# Patient Record
Sex: Female | Born: 1980 | Race: White | Hispanic: No | Marital: Married | Smoking: Never smoker
Health system: Southern US, Community
[De-identification: ages and names within clinical notes are randomized; demographics above are authoritative.]

## PROBLEM LIST (undated history)

## (undated) DIAGNOSIS — IMO0002 Reserved for concepts with insufficient information to code with codable children: Secondary | ICD-10-CM

## (undated) DIAGNOSIS — J342 Deviated nasal septum: Secondary | ICD-10-CM

## (undated) DIAGNOSIS — Z789 Other specified health status: Secondary | ICD-10-CM

## (undated) DIAGNOSIS — R87619 Unspecified abnormal cytological findings in specimens from cervix uteri: Secondary | ICD-10-CM

## (undated) HISTORY — DX: Unspecified abnormal cytological findings in specimens from cervix uteri: R87.619

## (undated) HISTORY — PX: DILATION AND CURETTAGE, DIAGNOSTIC / THERAPEUTIC: SUR384

## (undated) HISTORY — DX: Deviated nasal septum: J34.2

## (undated) HISTORY — DX: Reserved for concepts with insufficient information to code with codable children: IMO0002

## (undated) HISTORY — PX: DILATION AND CURETTAGE OF UTERUS: SHX78

## (undated) HISTORY — PX: WISDOM TOOTH EXTRACTION: SHX21

---

## 2002-01-20 ENCOUNTER — Encounter: Payer: Self-pay | Admitting: Emergency Medicine

## 2002-01-20 ENCOUNTER — Emergency Department (HOSPITAL_COMMUNITY): Admission: EM | Admit: 2002-01-20 | Discharge: 2002-01-20 | Payer: Self-pay | Admitting: Emergency Medicine

## 2004-02-14 ENCOUNTER — Other Ambulatory Visit: Admission: RE | Admit: 2004-02-14 | Discharge: 2004-02-14 | Payer: Self-pay | Admitting: Obstetrics and Gynecology

## 2005-06-20 ENCOUNTER — Other Ambulatory Visit: Admission: RE | Admit: 2005-06-20 | Discharge: 2005-06-20 | Payer: Self-pay | Admitting: Obstetrics and Gynecology

## 2006-07-06 ENCOUNTER — Inpatient Hospital Stay (HOSPITAL_COMMUNITY): Admission: AD | Admit: 2006-07-06 | Discharge: 2006-07-10 | Payer: Self-pay | Admitting: Obstetrics and Gynecology

## 2006-07-07 ENCOUNTER — Encounter (INDEPENDENT_AMBULATORY_CARE_PROVIDER_SITE_OTHER): Payer: Self-pay | Admitting: *Deleted

## 2008-10-26 ENCOUNTER — Encounter: Admission: RE | Admit: 2008-10-26 | Discharge: 2008-10-26 | Payer: Self-pay | Admitting: Obstetrics and Gynecology

## 2010-09-19 ENCOUNTER — Observation Stay (HOSPITAL_COMMUNITY)
Admission: EM | Admit: 2010-09-19 | Discharge: 2010-09-20 | Disposition: A | Discharge status: Home / Self Care | Payer: Managed Care, Other (non HMO) | Attending: Emergency Medicine | Admitting: Emergency Medicine

## 2010-09-19 ENCOUNTER — Emergency Department (HOSPITAL_COMMUNITY): Payer: Managed Care, Other (non HMO)

## 2010-09-19 ENCOUNTER — Inpatient Hospital Stay (HOSPITAL_COMMUNITY)
Admission: AD | Admit: 2010-09-19 | Discharge: 2010-09-19 | Disposition: A | Discharge status: Other Acute Inpatient Hospital | Payer: Managed Care, Other (non HMO) | Source: Ambulatory Visit | Attending: Obstetrics and Gynecology | Admitting: Obstetrics and Gynecology

## 2010-09-19 ENCOUNTER — Observation Stay (HOSPITAL_COMMUNITY): Payer: Managed Care, Other (non HMO)

## 2010-09-19 DIAGNOSIS — O99891 Other specified diseases and conditions complicating pregnancy: Secondary | ICD-10-CM | POA: Insufficient documentation

## 2010-09-19 DIAGNOSIS — H538 Other visual disturbances: Principal | ICD-10-CM | POA: Insufficient documentation

## 2010-09-19 DIAGNOSIS — O9989 Other specified diseases and conditions complicating pregnancy, childbirth and the puerperium: Secondary | ICD-10-CM | POA: Insufficient documentation

## 2010-09-19 LAB — URINALYSIS, ROUTINE W REFLEX MICROSCOPIC
Bilirubin Urine: NEGATIVE
Hgb urine dipstick: NEGATIVE
Ketones, ur: NEGATIVE mg/dL
Nitrite: NEGATIVE
Protein, ur: NEGATIVE mg/dL
Specific Gravity, Urine: 1.01 (ref 1.005–1.030)
Urine Glucose, Fasting: NEGATIVE mg/dL
Urobilinogen, UA: 0.2 mg/dL (ref 0.0–1.0)
pH: 7.5 (ref 5.0–8.0)

## 2010-09-19 LAB — POCT I-STAT, CHEM 8
BUN: 5 mg/dL — ABNORMAL LOW (ref 6–23)
Calcium, Ion: 1.19 mmol/L (ref 1.12–1.32)
Chloride: 104 mEq/L (ref 96–112)
Creatinine, Ser: 0.7 mg/dL (ref 0.4–1.2)
Glucose, Bld: 106 mg/dL — ABNORMAL HIGH (ref 70–99)
HCT: 40 % (ref 36.0–46.0)
Hemoglobin: 13.6 g/dL (ref 12.0–15.0)
Potassium: 3.6 mEq/L (ref 3.5–5.1)
Sodium: 139 mEq/L (ref 135–145)
TCO2: 21 mmol/L (ref 0–100)

## 2010-09-19 LAB — CBC
HCT: 37.9 % (ref 36.0–46.0)
Hemoglobin: 13.3 g/dL (ref 12.0–15.0)
MCH: 29.8 pg (ref 26.0–34.0)
MCHC: 35.1 g/dL (ref 30.0–36.0)
MCV: 85 fL (ref 78.0–100.0)
Platelets: 235 10*3/uL (ref 150–400)
RBC: 4.46 MIL/uL (ref 3.87–5.11)
RDW: 14 % (ref 11.5–15.5)
WBC: 10.4 10*3/uL (ref 4.0–10.5)

## 2010-09-19 LAB — COMPREHENSIVE METABOLIC PANEL
ALT: 13 U/L (ref 0–35)
AST: 15 U/L (ref 0–37)
Albumin: 3.9 g/dL (ref 3.5–5.2)
Alkaline Phosphatase: 35 U/L — ABNORMAL LOW (ref 39–117)
BUN: 6 mg/dL (ref 6–23)
CO2: 23 meq/L (ref 19–32)
Calcium: 9.1 mg/dL (ref 8.4–10.5)
Chloride: 107 mEq/L (ref 96–112)
Creatinine, Ser: 0.69 mg/dL (ref 0.4–1.2)
GFR calc Af Amer: >60 mL/min (ref >60)
GFR calc non Af Amer: >60 mL/min (ref >60)
Glucose, Bld: 112 mg/dL — ABNORMAL HIGH (ref 70–99)
Potassium: 3.7 mEq/L (ref 3.5–5.1)
Sodium: 138 mEq/L (ref 135–145)
Total Bilirubin: 0.5 mg/dL (ref 0.3–1.2)
Total Protein: 6.6 g/dL (ref 6.0–8.3)

## 2010-09-19 LAB — DIFFERENTIAL
Basophils Absolute: 0 10*3/uL (ref 0.0–0.1)
Basophils Relative: 0 % (ref 0–1)
Eosinophils Absolute: 0.1 10*3/uL (ref 0.0–0.7)
Eosinophils Relative: 1 % (ref 0–5)
Lymphocytes Relative: 14 % (ref 12–46)
Lymphs Abs: 1.5 10*3/uL (ref 0.7–4.0)
Monocytes Absolute: 0.6 10*3/uL (ref 0.1–1.0)
Monocytes Relative: 6 % (ref 3–12)
Neutro Abs: 8.3 10*3/uL — ABNORMAL HIGH (ref 1.7–7.7)
Neutrophils Relative %: 80 % — ABNORMAL HIGH (ref 43–77)

## 2010-09-19 LAB — APTT: aPTT: 28 seconds (ref 24–37)

## 2010-09-19 LAB — POCT CARDIAC MARKERS
CKMB, poc: <1 ng/mL — ABNORMAL LOW (ref 1.0–8.0)
Myoglobin, poc: 32.1 ng/mL (ref 12–200)
Troponin i, poc: <0.05 ng/mL (ref 0.00–0.09)

## 2010-09-19 LAB — CK TOTAL AND CKMB (NOT AT ARMC)
CK, MB: 0.5 ng/mL (ref 0.3–4.0)
Relative Index: INVALID (ref 0.0–2.5)
Total CK: 56 U/L (ref 7–177)

## 2010-09-19 LAB — PROTIME-INR
INR: 1.13 (ref 0.00–1.49)
Prothrombin Time: 14.7 s (ref 11.6–15.2)

## 2010-09-19 LAB — GLUCOSE, CAPILLARY: Glucose-Capillary: 95 mg/dL (ref 70–99)

## 2010-09-19 LAB — TROPONIN I: Troponin I: <0.01 ng/mL (ref 0.00–0.06)

## 2010-09-20 ENCOUNTER — Other Ambulatory Visit: Payer: Self-pay | Admitting: Obstetrics and Gynecology

## 2010-09-20 ENCOUNTER — Ambulatory Visit (HOSPITAL_COMMUNITY)
Admission: RE | Admit: 2010-09-20 | Discharge: 2010-09-20 | Disposition: A | Discharge status: Home / Self Care | Payer: Managed Care, Other (non HMO) | Source: Ambulatory Visit | Attending: Obstetrics and Gynecology | Admitting: Obstetrics and Gynecology

## 2010-09-20 DIAGNOSIS — G459 Transient cerebral ischemic attack, unspecified: Secondary | ICD-10-CM

## 2010-09-20 DIAGNOSIS — O021 Missed abortion: Secondary | ICD-10-CM | POA: Insufficient documentation

## 2010-09-20 LAB — LIPID PANEL
Cholesterol: 119 mg/dL (ref 0–200)
HDL: 59 mg/dL (ref >39)
LDL Cholesterol: 55 mg/dL (ref 0–99)
Total CHOL/HDL Ratio: 2 {ratio}
Triglycerides: 26 mg/dL (ref <150)
VLDL: 5 mg/dL (ref 0–40)

## 2010-09-20 LAB — HEMOGLOBIN A1C
Hgb A1c MFr Bld: 5.2 % (ref <5.7)
Mean Plasma Glucose: 103 mg/dL (ref <117)

## 2010-09-20 LAB — HCG, QUANTITATIVE, PREGNANCY: hCG, Beta Chain, Quant, S: 47378 m[IU]/mL — ABNORMAL HIGH (ref <5)

## 2010-10-03 NOTE — Op Note (Signed)
  [name], [name] NO.:  [id number]  MEDICAL RECORD NO.:  [id number]           PATIENT TYPE:  O  LOCATION:  [facility]                          [facility]  PHYSICIAN:  Janine Limbo, M.D.DATE OF BIRTH:  [birth year]  DATE OF PROCEDURE:  09/20/2010 DATE OF DISCHARGE:                              OPERATIVE REPORT   PREOPERATIVE DIAGNOSIS:  First trimester missed abortion.  POSTOPERATIVE DIAGNOSIS:  First trimester missed abortion.  PROCEDURE:  Suction dilatation and evacuation.  SURGEON:  Janine Limbo, MD  FIRST ASSISTANT:  None.  ANESTHETIC:  Monitored anesthetic control and paracervical block using 0.5% Marcaine with epinephrine.  DISPOSITION:  [name] is a 30 year old female, gravida 3, para 1-0-1- 1, who presents at 8-1/2 weeks' gestation with decreasing quantitative HCG values and a nonviable gestation on ultrasound.  The patient understands the indications for her surgical procedure and she accepts the risks of, but not limited to, anesthetic complications, bleeding, infection, and possible damage to the surrounding organs.  FINDINGS:  The patient's blood type is B+.  A moderate amount of products of conception were removed from within the uterine cavity.  No adnexal masses were appreciated.  PROCEDURE:  The patient was taken to the operating room where she was given medication through her IV line.  The patient's perineum and vagina were prepped with multiple layers of Betadine.  The bladder was drained of urine.  The patient was sterilely draped.  Examination under anesthesia was performed.  A paracervical block was placed using 10 mL of 0.5% Marcaine with epinephrine.  The uterus sounded to 9 cm.  The cervix was gently dilated.  The uterine cavity was then evacuated using a size 8 suction curette followed by medium sharp curette.  The cavity was felt to be clean at the end of our procedure.  Hemostasis was adequate.  All  instruments were removed.  The patient's exam was repeated.  Sponge and needle counts were correct.  The patient was awakened from her anesthetic without difficulty and then transported to the recovery room in stable condition.  She was given 30 mg of Toradol intramuscularly and 30 mg of Toradol intravenously.  The products of conception were sent to Pathology.  FOLLOWUP INSTRUCTIONS:  The patient will return to see Dr. Stefano Gaul in 2 weeks.  She was given a copy of the postoperative instruction sheet as prepared by the [facility] for patients who have undergone dilatation and evacuation.  She was given a prescription for: 1. Motrin 800 mg every 8 hours as needed for mild to moderate pain. 2. Vicodin 1-2 tablets every 4 hours as needed for severe pain. 3. Phenergan 25 mg every 6 hours as needed for nausea. 4. Methergine 0.2 mg 1 tablet every 8 hours for 2 days.     Janine Limbo, M.D.     AVS/MEDQ  D:  09/20/2010  T:  09/21/2010  Job:  [id number]  Electronically Signed by Kirkland Hun M.D. on 10/03/2010 11:16:05 AM

## 2010-10-03 NOTE — H&P (Signed)
  NAMELAVON, HORN NO.:  [id number]  MEDICAL RECORD NO.:  [id number]           PATIENT TYPE:  O  LOCATION:  [facility]                          [facility]  PHYSICIAN:  Janine Limbo, M.D.DATE OF BIRTH:  06/21/1981  DATE OF ADMISSION:  09/20/2010 DATE OF DISCHARGE:                             HISTORY & PHYSICAL   HISTORY OF PRESENT ILLNESS:  Ms. Schremp is a 30 year old female, gravida 3, para 1-0-1-1, who presents at 8-1/2 weeks' gestation with a missed abortion.  The patient has been followed at the [facility] of [facility]. The patient had a quantitative HCG values of 65,000 that was repeated and her HCG value was then 47,000.  Several ultrasounds have shown a nonviable gestation.  She presents for a dilatation and evacuation.  OBSTETRICAL HISTORY:  The patient has had one term cesarean section and one first trimester miscarriage.  DRUG ALLERGIES:  No known drug allergies.  PAST MEDICAL HISTORY:  The patient denies hypertension and diabetes.  REVIEW OF SYSTEMS:  The patient denies cigarette use, alcohol use, and recreational drug use.  REVIEW OF SYSTEMS:  The patient has had some decreased vision and has been evaluated by the emergency department at the [facility].  FAMILY HISTORY:  Noncontributory.  PHYSICAL EXAM:  VITAL SIGNS:  The patient's weight is approximately 160 pounds, her height is 5 feet 90 inches. HEENT:  Within normal limits. CHEST:  Clear. HEART:  Regular rate and rhythm. BREASTS:  Without masses. ABDOMEN:  Nontender. EXTREMITIES:  Grossly normal. NEUROLOGIC:  Grossly normal. PELVIC:  Cervix is closed and long.  LABORATORY VALUES:  Blood type is B+, antibody screen negative, VDRL nonreactive, rubella immune.  HBsAG negative, HIV nonreactive, gonorrhea negative, chlamydia negative.  ASSESSMENT: 1. An 8-1/2 weeks' gestation. 2. Missed abortion.  PLAN:   The patient will undergo a dilatation and evacuation.  The patient understands the indications for her procedure and she accepts the risks of, but not limited to, anesthetic complications, bleeding, infection, and possible damage to the surrounding organs.     Janine Limbo, M.D.     AVS/MEDQ  D:  09/20/2010  T:  09/21/2010  Job:  [id number]  Electronically Signed by Kirkland Hun M.D. on 10/03/2010 11:15:54 AM

## 2010-12-08 NOTE — Op Note (Signed)
[name], [name]           ACCOUNT NO.:  [id number]   MEDICAL RECORD NO.:  [id number]          PATIENT TYPE:  INP   LOCATION:  9163                          [facility]  [facility]   PHYSICIAN:  Guy Sandifer. Henderson Cloud, M.D. DATE OF BIRTH:  1980-09-24   DATE OF PROCEDURE:  07/07/2006  DATE OF DISCHARGE:                               OPERATIVE REPORT   PREOPERATIVE DIAGNOSIS:  1. Nonreassuring fetal heart tracing.  2. Arrest of dilation.   POSTOPERATIVE DIAGNOSIS:  1. Nonreassuring fetal heart tracing.  2. Arrest of dilation.   PROCEDURE:  Low transverse cesarean section.   SURGEON:  Guy Sandifer. Henderson Cloud, M.D.   ANESTHESIA:  Epidural Burnett Corrente, M.D.   SPECIMENS:  Placenta to pathology.   ESTIMATED BLOOD LOSS:  800 mL.   FINDINGS:  Viable female infant, Apgars of 9and 9 at 1 and 5 minutes  respectively.  Birth weight 8 pounds 5 ounces.  Arterial cord pH 7.29.   INDICATIONS FOR PROCEDURE:  The patient is a 30 year old single white  female G1, P0, EDC of July 14, 2006 with a negative strep culture.  The patient complained of spontaneous rupture of membranes for clear  fluid at approximately 10:30 p.m. on July 06, 2006.  She was  observed through the night and had relatively few uterine contractions.  Fetal heart tones were reactive.  Cervix is a 1, thick, and posterior  per the nurse's checked.  Ultrasound confirms vertex presentation.  Pitocin augmentation was started out.  At approximately 2:00 p.m. cervix  3 cm dilated, 80 percent effaced, -1 station.  Internal uterine pressure  catheter was placed.  As of 4:30 p.m. cervix is essentially the same per  the nurse's checked.  There is some variable decelerations.  Temperature  is 100.1.  The patient's given Tylenol and Unasyn is started  intravenously.  Epidural is ordered p.r.n.  As of approximately 6:45  p.m. the patient was noted to have some deeper decelerations to the 60s.  Pitocin was discontinued and oxygen was  administered.  The patient has  already received Effedra and intravenous fluids.  Cervix was 4 cm  dilated, complete, -2 station.  Observation over additional 15-20  minutes revealed a fetal heart tracing of 160s with some subtle  decelerations and no accelerations.  Diagnosis of arrest of dilation as  well as nonreassuring fetal heart tracing was made.  Recommendation for  cesarean section is made.  All questions were answered.  The patient  agrees.  Potential risks and complications discussed preoperatively  including but not limited to infection, bowel, bladder, ureteral damage,  bleeding requiring transfusion of blood products with possible  transfusion reaction, HIV and hepatitis acquisition, DVT, PE, pneumonia.   PROCEDURE:  The patient is taken to operating room where she is  identified.  Epidural was augmented to surgical level.  She is placed in  dorsosupine position with 15 degrees left lateral wedge.  Foley catheter  was already in place.  She is prepped and draped in sterile fashion.  After testing for adequate epidural anesthesia skin is entered through  Pfannenstiel incision and dissection is carried out in  layers to  peritoneum.  Peritoneum was incised and extended superiorly and  inferiorly.  Vesicouterine peritoneum was taken down cephalolaterally.  The bladder flap was developed.  The bladder blade is placed.  Uterus  was incised in a low transverse manner.  The uterine cavity is entered  bluntly with a hemostat.  The uterine incision is extended  cephalolaterally with fingers.  Baby is noted to be left occiput  transverse.  Vertex is easily elevated and delivered.  Oral and  nasopharynx were suctioned.  Remainder of the baby is delivered and good  cry and tone is noted.  Cord is clamped and cut and the baby is handed  to awaiting pediatrics team.  Placenta was manually delivered and sent  to pathology.  Uterus is clean.  Uterus is closed in two running locking   imbricating layers of 0 Monocryl suture.  A single figure-of-eight is  placed in the middle of the incision to obtain complete hemostasis.  Tubes and ovaries are normal bilaterally.  Anterior peritoneum was  closed in running fashion with 0 Monocryl suture which was also used to  reapproximate the pyramidalis muscle in the midline.  Anterior rectus  fascia is closed running fashion with 0 PDS suture and the skin is  closed with clips.  All sponge, instrument and needle counts correct and  the patient is transferred to recovery room in stable condition.      Guy Sandifer Henderson Cloud, M.D.  Electronically Signed     JET/MEDQ  D:  07/07/2006  T:  07/07/2006  Job:  [id number]

## 2010-12-08 NOTE — Discharge Summary (Signed)
NAMELAINEE, LEHRMAN           ACCOUNT NO.:  [id number]   MEDICAL RECORD NO.:  [id number]          PATIENT TYPE:  INP   LOCATION:  9102                          [facility]   PHYSICIAN:  Michelle L. Grewal, M.D.DATE OF BIRTH:  06-20-1981   DATE OF ADMISSION:  07/06/2006  DATE OF DISCHARGE:  07/10/2006                               DISCHARGE SUMMARY   ADMITTING DIAGNOSES:  1. Intrauterine pregnancy at 55 weeks estimated gestational age.  2. Spontaneous rupture of membranes.   DISCHARGE DIAGNOSES:  1. Status post low transverse cesarean section secondary to arrest of      dilatation and nonreassuring fetal heart tones.  2. Viable female infant.   PROCEDURE:  Primary low transverse cesarean section.   REASON FOR ADMISSION:  Please see written H&P.   HOSPITAL COURSE:  The patient is 30 year old white single female  primigravida that presented to [facility] with  spontaneous rupture of membranes.  Fluid was noted to be clear.  The  patient had known negative beta strep culture.  The patient was observed  through the night and had relatively few contractions.  Fetal heart  tones were reactive.  Cervix was dilated to 1 cm, thick, and posterior.  Ultrasound was performed which revealed and confirmed vertex  presentation.  Pitocin augmentation was started and intrauterine  pressure catheter was placed for observation of adequate labor.  On the  following afternoon, cervix was essentially unchanged and there were  noted some variable decelerations.  The patient did develop a fever of  100.1.  She was given Tylenol and Unasyn was started intravenously.  The  patient was administered epidural for her comfort.  Later the patient  was noted to have some further fetal deceleration into the 60s and  Pitocin was discontinued and oxygen administration was given.  The  patient had already received ephedra and IV fluids.  Cervix was now 4 cm  dilated, completely effaced,  with vertex at a -2 station.  Observation  over an additional 15-20 minutes revealed some fetal heart tone tracings  of 160s with some subtle decelerations and no acceleration.  Decision  was made to proceed with a primary low transverse cesarean section.  The  patient was then transferred to the operating room where epidural was  dosed to an adequate surgical level.  A low transverse incision was made  with delivery of a viable female infant weighing 8 pounds 5 ounces with  Apgars of 9 at 1 minute, 9 at 5 minutes.  Arterial cord ph was 7.29.  The patient tolerated the procedure well and was taken to the recovery  room in stable condition.  The following morning, the patient was  without complaint.  Vital signs were stable.  She was afebrile.  Temp  current was 98.4, temp max of 100.5 at 9:30 the previous evening.  Abdomen soft with good return of bowel function.  Fundus was firm and  nontender.  Abdominal dressing was noted to be clean, dry and intact.  Laboratory findings revealed hemoglobin of 10.1, platelet count of  174,000, WBC count of 22.2. Intravenous antibiotics were continued for  a  total 24 hours.  On the following morning, the patient was without  complaint.  Vital signs were stable.  She was now afebrile.  Abdomen  soft.  Fundus firm and nontender.  Abdominal dressing had been removed  revealing an incision that was clean, dry and intact. Intravenous  antibiotics were discontinued.  The patient was tolerating a regular  diet without complaints of nausea or vomiting. On postoperative day #3,  the patient was still without complaint.  Vital signs were stable.  Abdomen soft.  Fundus firm and nontender.  Incision was clean, dry and  intact. Staples were removed and patient was later discharged home.   CONDITION ON DISCHARGE:  Stable.   DIET:  Regular, as tolerated.   ACTIVITY:  No heavy lifting, no driving times 2 weeks, no vaginal entry.   FOLLOWUP:  The patient to follow  up in the office in 1 week for an  incision check.  She is to call for temperature greater than 100  degrees, persistent nausea, vomiting, heavy vaginal bleeding, and/or  redness or drainage from the incisional site.   DISCHARGE MEDICATIONS:  Tylox #30 one p.o. every 4-6 hours p.r.n.,  Motrin every 6 hours p.r.n., prenatal vitamins one p.o. daily, Colace 1  p.o. daily p.r.n.      Julio Sicks, N.P.      Stann Mainland. Vincente Poli, M.D.  Electronically Signed    CC/MEDQ  D:  07/26/2006  T:  07/26/2006  Job:  [id number]

## 2011-01-11 LAB — HIV ANTIBODY (ROUTINE TESTING W REFLEX): HIV: NONREACTIVE

## 2011-01-11 LAB — ABO/RH: RH Type: POSITIVE

## 2011-01-11 LAB — HEPATITIS B SURFACE ANTIGEN: Hepatitis B Surface Ag: NEGATIVE

## 2011-01-11 LAB — GC/CHLAMYDIA PROBE AMP, GENITAL
Chlamydia: NEGATIVE
Gonorrhea: NEGATIVE

## 2011-01-11 LAB — ANTIBODY SCREEN: Antibody Screen: NEGATIVE

## 2011-01-11 LAB — RPR: RPR: NONREACTIVE

## 2011-01-11 LAB — RUBELLA ANTIBODY, IGM: Rubella: IMMUNE

## 2011-07-24 NOTE — L&D Delivery Note (Signed)
Delivery Note  At 0945 pt was having stronger urge to push and was complete and got back in tub and pushed well w ctx on hands and knees for SVD.  At 10:30 AM a viable female was delivered via VBAC, Spontaneous (Presentation: Right Occiput Anterior).  APGAR: 9, 9; weight .  9#13oz Placenta status: Intact, Manual removal.  Cord: 3 vessels with the following complications: None.  Cord evulsed, manual placenta removal  Anesthesia: None  Episiotomy: none Lacerations: 1st degree and R and L labial Suture Repair: 3.0 vicryl rapide 4-0 monocryl Est. Blood Loss (mL): 350  Mom to postpartum.  Baby to nursery-stable. Skin-skin Mom plans to BF Dr Estanislado Pandy notified   Malissa Hippo 08/27/2011, 11:44 AM

## 2011-07-26 LAB — STREP B DNA PROBE: GBS: POSITIVE

## 2011-08-26 ENCOUNTER — Inpatient Hospital Stay (HOSPITAL_COMMUNITY)
Admission: AD | Admit: 2011-08-26 | Discharge: 2011-08-29 | DRG: 372 | Disposition: A | Discharge status: Home / Self Care | Payer: Federal, State, Local not specified - PPO | Source: Ambulatory Visit | Attending: Obstetrics and Gynecology | Admitting: Obstetrics and Gynecology

## 2011-08-26 ENCOUNTER — Encounter (HOSPITAL_COMMUNITY): Payer: Self-pay | Admitting: *Deleted

## 2011-08-26 DIAGNOSIS — Z22330 Carrier of Group B streptococcus: Secondary | ICD-10-CM

## 2011-08-26 DIAGNOSIS — O99892 Other specified diseases and conditions complicating childbirth: Secondary | ICD-10-CM | POA: Diagnosis present

## 2011-08-26 DIAGNOSIS — O34219 Maternal care for unspecified type scar from previous cesarean delivery: Principal | ICD-10-CM | POA: Diagnosis present

## 2011-08-26 LAB — CBC
HCT: 35 % — ABNORMAL LOW (ref 36.0–46.0)
Hemoglobin: 11.5 g/dL — ABNORMAL LOW (ref 12.0–15.0)
MCH: 25.3 pg — ABNORMAL LOW (ref 26.0–34.0)
MCHC: 32.9 g/dL (ref 30.0–36.0)
MCV: 77.1 fL — ABNORMAL LOW (ref 78.0–100.0)
Platelets: 188 10*3/uL (ref 150–400)
RBC: 4.54 MIL/uL (ref 3.87–5.11)
RDW: 15.2 % (ref 11.5–15.5)
WBC: 13.7 10*3/uL — ABNORMAL HIGH (ref 4.0–10.5)

## 2011-08-26 MED ORDER — SODIUM CHLORIDE 0.9 % IJ SOLN: 3.0000 mL | INTRAMUSCULAR | Status: DC | PRN

## 2011-08-26 MED ORDER — OXYTOCIN 20 UNITS IN LACTATED RINGERS INFUSION - SIMPLE
125.0000 mL/h | Freq: Once | INTRAVENOUS | Status: AC
  Administered 2011-08-27: 125 mL/h via INTRAVENOUS

## 2011-08-26 MED ORDER — LACTATED RINGERS IV SOLN: INTRAVENOUS | Status: DC

## 2011-08-26 MED ORDER — OXYTOCIN BOLUS FROM INFUSION
500.0000 mL | Freq: Once | INTRAVENOUS | Status: DC
  Filled 2011-08-26: qty 500
  Filled 2011-08-26: qty 1000

## 2011-08-26 MED ORDER — IBUPROFEN 600 MG PO TABS
600.0000 mg | ORAL_TABLET | Freq: Four times a day (QID) | ORAL | Status: DC | PRN
  Administered 2011-08-27: 600 mg via ORAL
  Filled 2011-08-26: qty 1

## 2011-08-26 MED ORDER — PROMETHAZINE HCL 25 MG/ML IJ SOLN: 12.5000 mg | INTRAMUSCULAR | Status: DC | PRN

## 2011-08-26 MED ORDER — CITRIC ACID-SODIUM CITRATE 334-500 MG/5ML PO SOLN: 30.0000 mL | ORAL | Status: DC | PRN

## 2011-08-26 MED ORDER — ACETAMINOPHEN 325 MG PO TABS: 650.0000 mg | ORAL_TABLET | ORAL | Status: DC | PRN

## 2011-08-26 MED ORDER — LIDOCAINE HCL (PF) 1 % IJ SOLN
30.0000 mL | INTRAMUSCULAR | Status: DC | PRN
  Administered 2011-08-27: 30 mL via SUBCUTANEOUS
  Filled 2011-08-26: qty 30

## 2011-08-26 MED ORDER — ONDANSETRON HCL 4 MG/2ML IJ SOLN: 4.0000 mg | Freq: Four times a day (QID) | INTRAMUSCULAR | Status: DC | PRN

## 2011-08-26 MED ORDER — PENICILLIN G POTASSIUM 5000000 UNITS IJ SOLR
5.0000 10*6.[IU] | Freq: Once | INTRAVENOUS | Status: AC
  Administered 2011-08-26: 5 10*6.[IU] via INTRAVENOUS
  Filled 2011-08-26: qty 5

## 2011-08-26 MED ORDER — LACTATED RINGERS IV SOLN: 500.0000 mL | INTRAVENOUS | Status: DC | PRN

## 2011-08-26 MED ORDER — PENICILLIN G POTASSIUM 5000000 UNITS IJ SOLR
2.5000 10*6.[IU] | INTRAVENOUS | Status: DC
  Administered 2011-08-27 (×2): 2.5 10*6.[IU] via INTRAVENOUS
  Filled 2011-08-26 (×5): qty 2.5

## 2011-08-26 MED ORDER — SODIUM CHLORIDE 0.9 % IV SOLN: 250.0000 mL | INTRAVENOUS | Status: DC | PRN

## 2011-08-26 MED ORDER — OXYCODONE-ACETAMINOPHEN 5-325 MG PO TABS: 1.0000 | ORAL_TABLET | ORAL | Status: DC | PRN

## 2011-08-26 MED ORDER — FLEET ENEMA 7-19 GM/118ML RE ENEM: 1.0000 | ENEMA | RECTAL | Status: DC | PRN

## 2011-08-26 NOTE — Progress Notes (Signed)
Pt states she is in labor and her water broke at 1630

## 2011-08-26 NOTE — Progress Notes (Signed)
Pt states her water broke @ 1630 clear fluid.  Also c/o ctx q 5-6 min apart

## 2011-08-26 NOTE — ED Provider Notes (Signed)
[name] is a 31 y.o. female presenting for evaluation secondary to SROM at 4:30p.  History OB History    Grav Para Term Preterm Abortions TAB SAB Ect Mult Living   4 1 1  2  2   1      Past Medical History  Diagnosis Date  . No pertinent past medical history    Past Surgical History  Procedure Date  . Cesarean section   . Wisdom tooth extraction    Family History: family history is not on file. Social History:  reports that she has never smoked. She does not have any smokeless tobacco history on file. She reports that she does not drink alcohol or use illicit drugs.  ROS Noncontributory.  Denies any other symptoms.  Dilation: 3 Effacement (%): 100 Station: -1 Exam by:: M. Chiquita Loth CNM Blood pressure 121/81, pulse 88, temperature 98 F (36.7 C), resp. rate 20, height 5\' 9"  (1.753 m), weight 88.905 kg (196 lb), SpO2 99.00%.   Exam Physical Exam  Lungs CTA bil CV RRR Abd gravid, NT Ext no calf tenderness  FHT 130s - 140s, + small accels, no decels, + variability Toco about q 5 min but not picking up well  Prenatal labs: ABO, Rh:  B+ Antibody:  neg Rubella:  Immune RPR:   NR HBsAg:   neg HIV:   NR GBS:   positive Normal anatomy scan Declined Genetic Testing  Spec Exam pooling (per Lavera Guise)  Assessment/Plan: P1 @ 40 2/7 wks with SROM in early labor desiring a VBAC via waterbirth.  Patient understands the risks/ benefits/alternatives of a VBAC and would like to proceed.  Fetal status is overall reassuring.  Pt says she signed consent for VBAC in office.PCN for GBS+.  Purcell Nails 08/26/2011, 9:43 PM

## 2011-08-26 NOTE — H&P (Signed)
[name] is a 31 y.o. female presenting for evaluation secondary to SROM at 4:30p. No VB, + ctxs, +FM.  History  OB History    Grav  Para  Term  Preterm  Abortions  TAB  SAB  Ect  Mult  Living    4  1  1   2   2    1       Past Medical History   Diagnosis  Date   .  No pertinent past medical history     Past Surgical History   Procedure  Date   .  Cesarean section    .  Wisdom tooth extraction     Family History: family history is not on file.  Social History: reports that she has never smoked. She does not have any smokeless tobacco history on file. She reports that she does not drink alcohol or use illicit drugs.   ROS  Noncontributory. Denies any other symptoms.   Dilation: 3  Effacement (%): 100  Station: -1  Exam by:: M. Chiquita Loth CNM  Blood pressure 121/81, pulse 88, temperature 98 F (36.7 C), resp. rate 20, height 5\' 9"  (1.753 m), weight 88.905 kg (196 lb), SpO2 99.00%.  Exam  Physical Exam  Lungs CTA bil  CV RRR  Abd gravid, NT  Ext no calf tenderness  FHT 130s - 140s, + small accels, no decels, + variability  Toco about q 5 min but not picking up well   Prenatal labs:  ABO, Rh: B+  Antibody: neg  Rubella: Immune  RPR: NR  HBsAg: neg  HIV: NR  GBS: positive  Normal anatomy scan  Declined Genetic Testing  Spec Exam pooling (per Lavera Guise)   Assessment/Plan:  P1 @ 40 2/7 wks with SROM in early labor desiring a VBAC via waterbirth. Patient understands the risks/ benefits/alternatives of a VBAC and would like to proceed. Fetal status is overall reassuring. Pt says she signed consent for VBAC in office.PCN for GBS+.  Purcell Nails  08/26/2011, 9:43 PM

## 2011-08-27 ENCOUNTER — Encounter (HOSPITAL_COMMUNITY): Payer: Self-pay | Admitting: *Deleted

## 2011-08-27 LAB — RPR: RPR Ser Ql: NONREACTIVE

## 2011-08-27 MED ORDER — SODIUM CHLORIDE 0.9 % IJ SOLN: 3.0000 mL | Freq: Two times a day (BID) | INTRAMUSCULAR | Status: DC

## 2011-08-27 MED ORDER — SENNOSIDES-DOCUSATE SODIUM 8.6-50 MG PO TABS
2.0000 | ORAL_TABLET | Freq: Every day | ORAL | Status: DC
  Administered 2011-08-27 – 2011-08-28 (×2): 2 via ORAL

## 2011-08-27 MED ORDER — BUTORPHANOL TARTRATE 2 MG/ML IJ SOLN: 2.0000 mg | INTRAMUSCULAR | Status: DC | PRN

## 2011-08-27 MED ORDER — OXYTOCIN BOLUS FROM INFUSION: 500.0000 mL | Freq: Once | INTRAVENOUS | Status: DC

## 2011-08-27 MED ORDER — ONDANSETRON HCL 4 MG/2ML IJ SOLN: 4.0000 mg | Freq: Four times a day (QID) | INTRAMUSCULAR | Status: DC | PRN

## 2011-08-27 MED ORDER — ONDANSETRON HCL 4 MG PO TABS: 4.0000 mg | ORAL_TABLET | ORAL | Status: DC | PRN

## 2011-08-27 MED ORDER — ZOLPIDEM TARTRATE 5 MG PO TABS: 5.0000 mg | ORAL_TABLET | Freq: Every evening | ORAL | Status: DC | PRN

## 2011-08-27 MED ORDER — LACTATED RINGERS IV SOLN: INTRAVENOUS | Status: DC

## 2011-08-27 MED ORDER — FLEET ENEMA 7-19 GM/118ML RE ENEM: 1.0000 | ENEMA | RECTAL | Status: DC | PRN

## 2011-08-27 MED ORDER — WITCH HAZEL-GLYCERIN EX PADS: 1.0000 "application " | MEDICATED_PAD | CUTANEOUS | Status: DC | PRN

## 2011-08-27 MED ORDER — LIDOCAINE HCL (PF) 1 % IJ SOLN: 30.0000 mL | INTRAMUSCULAR | Status: DC | PRN

## 2011-08-27 MED ORDER — DIPHENHYDRAMINE HCL 25 MG PO CAPS: 25.0000 mg | ORAL_CAPSULE | Freq: Four times a day (QID) | ORAL | Status: DC | PRN

## 2011-08-27 MED ORDER — CITRIC ACID-SODIUM CITRATE 334-500 MG/5ML PO SOLN: 30.0000 mL | ORAL | Status: DC | PRN

## 2011-08-27 MED ORDER — ACETAMINOPHEN 325 MG PO TABS: 650.0000 mg | ORAL_TABLET | ORAL | Status: DC | PRN

## 2011-08-27 MED ORDER — LANOLIN HYDROUS EX OINT: TOPICAL_OINTMENT | CUTANEOUS | Status: DC | PRN

## 2011-08-27 MED ORDER — MEASLES, MUMPS & RUBELLA VAC ~~LOC~~ INJ: 0.5000 mL | INJECTION | Freq: Once | SUBCUTANEOUS | Status: DC

## 2011-08-27 MED ORDER — BENZOCAINE-MENTHOL 20-0.5 % EX AERO
1.0000 "application " | INHALATION_SPRAY | CUTANEOUS | Status: DC | PRN
  Administered 2011-08-27: 1 via TOPICAL

## 2011-08-27 MED ORDER — OXYTOCIN 20 UNITS IN LACTATED RINGERS INFUSION - SIMPLE: 125.0000 mL/h | Freq: Once | INTRAVENOUS | Status: DC

## 2011-08-27 MED ORDER — TETANUS-DIPHTH-ACELL PERTUSSIS 5-2.5-18.5 LF-MCG/0.5 IM SUSP
0.5000 mL | Freq: Once | INTRAMUSCULAR | Status: AC
  Administered 2011-08-28: 0.5 mL via INTRAMUSCULAR
  Filled 2011-08-27: qty 0.5

## 2011-08-27 MED ORDER — ONDANSETRON HCL 4 MG/2ML IJ SOLN: 4.0000 mg | INTRAMUSCULAR | Status: DC | PRN

## 2011-08-27 MED ORDER — LACTATED RINGERS IV SOLN: 500.0000 mL | INTRAVENOUS | Status: DC | PRN

## 2011-08-27 MED ORDER — OXYCODONE-ACETAMINOPHEN 5-325 MG PO TABS: 1.0000 | ORAL_TABLET | ORAL | Status: DC | PRN

## 2011-08-27 MED ORDER — PRENATAL MULTIVITAMIN CH
1.0000 | ORAL_TABLET | Freq: Every day | ORAL | Status: DC
  Administered 2011-08-28: 1 via ORAL
  Filled 2011-08-27: qty 1

## 2011-08-27 MED ORDER — BENZOCAINE-MENTHOL 20-0.5 % EX AERO
INHALATION_SPRAY | CUTANEOUS | Status: AC
  Administered 2011-08-27: 1 via TOPICAL
  Filled 2011-08-27: qty 56

## 2011-08-27 MED ORDER — IBUPROFEN 600 MG PO TABS
600.0000 mg | ORAL_TABLET | Freq: Four times a day (QID) | ORAL | Status: DC
  Administered 2011-08-27 – 2011-08-29 (×8): 600 mg via ORAL
  Filled 2011-08-27 (×8): qty 1

## 2011-08-27 MED ORDER — SIMETHICONE 80 MG PO CHEW: 80.0000 mg | CHEWABLE_TABLET | ORAL | Status: DC | PRN

## 2011-08-27 MED ORDER — HYDROXYZINE HCL 50 MG PO TABS: 50.0000 mg | ORAL_TABLET | Freq: Four times a day (QID) | ORAL | Status: DC | PRN

## 2011-08-27 MED ORDER — IBUPROFEN 600 MG PO TABS: 600.0000 mg | ORAL_TABLET | Freq: Four times a day (QID) | ORAL | Status: DC | PRN

## 2011-08-27 MED ORDER — HYDROXYZINE HCL 50 MG/ML IM SOLN: 50.0000 mg | Freq: Four times a day (QID) | INTRAMUSCULAR | Status: DC | PRN

## 2011-08-27 MED ORDER — DIBUCAINE 1 % RE OINT: 1.0000 "application " | TOPICAL_OINTMENT | RECTAL | Status: DC | PRN

## 2011-08-27 NOTE — Progress Notes (Signed)
Breathing well with uc O VSS     Fhts 120s LTV mod accels     abd soft gravid, nt     uc mod q 2-6     Vag 4-5 100 -1/0 VTX R clear A 40 3/7 week IUP     VBAC P continue care Lavera Guise, CNM

## 2011-08-27 NOTE — Treatment Plan (Signed)
08/26/11 @ 2230 Pt presenting as a TOLAC desiring a waterbirth. Westley Foots RN discussed with patient that [facility] policy did not allow TOLAC with waterbirth. After discussing with DR Pollyann Glen CNMCleotis Nipper RN, AD to [facility] & Dr Lavina Hamman, Chief OB, it was decided that [facility] with waterbirth is permissible due to decision made by [facility] 08/17/11. Efforts were made to contact Amy Skrinjar by phone and text, no response received.

## 2011-08-27 NOTE — Progress Notes (Signed)
Breathing well with uc  O VSS Fhts 130-140sLTV mod accels  abd soft gravid, nt  uc mod q 2-6  Vag 5 100 -1/0 VTX R clear no capit no molding Probable inadequate uc A 40 3/7 week IUP  VBAC  P discussed pitocin and declines, continue care  Lavera Guise, CNM

## 2011-08-27 NOTE — Progress Notes (Signed)
Patient ID: [name], female   DOB: November 06, 1980, 31 y.o.   MRN: [id number] .Subjective:  Doing well, breathing with ctx, some pressure, in tub again now  Objective: BP 116/70  Pulse 73  Temp(Src) 98.7 F (37.1 C) (Oral)  Resp 20  Ht 5\' 9"  (1.753 m)  Wt 88.905 kg (196 lb)  BMI 28.94 kg/m2  SpO2 99%   FHT:  FHR: 130 bpm, variability: moderate,  accelerations:  Present,  decelerations:  Absent UC:   regular, every 2-3 minutes SVE:   Dilation: 8 Effacement (%): 100 Station: +1 Exam by:: SowderRNC    Assessment / Plan: Spontaneous labor, progressing normally GBS pos, tx'd    Fetal Wellbeing:  Category I Pain Control:  Labor support without medications and waterbirth  Dr Estanislado Pandy updated  Malissa Hippo 08/27/2011, 9:13 AM

## 2011-08-27 NOTE — Progress Notes (Signed)
Breathing well with uc in tub O VSS      Fhts 130 LTV min to mod      uc q 2-4 mod      Vag not assessed A 40 3/7 week IUP SROM      For VBAC P continue care. Dr. Su Hilt updated at 0700 on pt status per telephone. Lavera Guise, CNM

## 2011-08-28 LAB — CBC
HCT: 27.8 % — ABNORMAL LOW (ref 36.0–46.0)
Hemoglobin: 8.8 g/dL — ABNORMAL LOW (ref 12.0–15.0)
MCH: 24.9 pg — ABNORMAL LOW (ref 26.0–34.0)
MCHC: 31.7 g/dL (ref 30.0–36.0)
MCV: 78.5 fL (ref 78.0–100.0)
Platelets: 173 10*3/uL (ref 150–400)
RBC: 3.54 MIL/uL — ABNORMAL LOW (ref 3.87–5.11)
RDW: 14.8 % (ref 11.5–15.5)
WBC: 13.3 10*3/uL — ABNORMAL HIGH (ref 4.0–10.5)

## 2011-08-28 MED ORDER — POLYSACCHARIDE IRON 150 MG PO CAPS
150.0000 mg | ORAL_CAPSULE | Freq: Two times a day (BID) | ORAL | Status: DC
  Administered 2011-08-28 (×2): 150 mg via ORAL
  Filled 2011-08-28 (×3): qty 1

## 2011-08-28 MED ORDER — ACETAMINOPHEN 500 MG PO TABS
1000.0000 mg | ORAL_TABLET | Freq: Four times a day (QID) | ORAL | Status: DC | PRN
  Administered 2011-08-28 – 2011-08-29 (×2): 1000 mg via ORAL
  Filled 2011-08-28 (×2): qty 2

## 2011-08-28 NOTE — Progress Notes (Addendum)
Post Partum Day 1 Subjective: up ad lib, voiding, tolerating PO, + flatus and last BM Sunday; Declines percocet, but feels pain return 30-60 min before Motrin due; Will order Tylenol ES to add prn.  Using benzocaine and ice.  Stitches "pull" when she moves.  Breastfeeding well.   Husband at bedside and supportive.  No dizziness.    Objective: Blood pressure 90/51, pulse 60, temperature 98.1 F (36.7 C), temperature source Oral, resp. rate 18, height 5\' 9"  (1.753 m), weight 88.905 kg (196 lb), SpO2 96.00%, unknown if currently breastfeeding.  Physical Exam:  General: alert, cooperative and no distress Lochia: appropriate Uterine Fundus: firm, below umbilicus. Incision: n/a DVT Evaluation: No evidence of DVT seen on physical exam. Negative Homan's sign. No significant calf/ankle edema.   Basename 08/28/11 0525 08/26/11 2155  HGB 8.8* 11.5*  HCT 27.8* 35.0*    Assessment/Plan: Plan for discharge tomorrow, Breastfeeding and Lactation consult PP anemia--will do orthostatics today and begin Fe supplement.    LOS: 2 days   STEELMAN,CANDICE H 08/28/2011, 11:37 AM    Orthostatic pulses increased from 79 to 120 from lying to standing but patient was without symptoms.  Will repeat orthostatic vital signs prior to discharge tomorrow.

## 2011-08-29 MED ORDER — IBUPROFEN 600 MG PO TABS: 600.0000 mg | ORAL_TABLET | Freq: Four times a day (QID) | ORAL | Status: AC

## 2011-08-29 NOTE — Discharge Summary (Signed)
Obstetric Discharge Summary Reason for Admission: onset of labor and rupture of membranes Prenatal Procedures: ultrasound Intrapartum Procedures: spontaneous vaginal delivery Postpartum Procedures: none Complications-Operative and Postpartum: perineal and labial Pregnancy complicated history or prior cesarean section Hemoglobin  Date Value Range Status  08/28/2011 8.8* 12.0-15.0 (g/dL) Final     DELTA CHECK NOTED     REPEATED TO VERIFY     HCT  Date Value Range Status  08/28/2011 27.8* 36.0-46.0 (%) Final    Discharge Diagnoses: Term Pregnancy-delivered  Discharge Information: Date: 08/29/2011 Activity: nothing in vagina x 6 weeks Diet: routine Medications: PNV, Ibuprofen and Iron Condition: stable Instructions: refer to practice specific booklet Discharge to: home Follow-up Information    Follow up with [facility] in 6 weeks.        30 y/o G2P2 PP day 2 PP. Stable denies complaints. A: fundus firm 2 below. Scant serosa flow.  P: f/u PP visit x 6 weeks. Plans abstinence as BCM. Info given re: long-term bc options.   Newborn Data: Live born female  Birth Weight: 9 lb 13 oz (4451 g) APGAR: 9, 9   Cerrone Debold CORI 08/29/2011, 8:42 AM

## 2011-08-29 NOTE — Discharge Instructions (Signed)
Vaginal Birth After Cesarean Delivery Vaginal birth after Cesarean delivery (VBAC) is giving birth vaginally after previously delivering a baby by a cesarean. In the past, if a woman had a Cesarean delivery, all births afterwards would be done by Cesarean delivery. This is no longer true. It can be safe for the mother to try a vaginal delivery after having a Cesarean. The final decision to have a VBAC or repeat Cesarean delivery should be between the patient and her caregiver. The risks and benefits can be discussed relative to the reason for, and the type of the previous Cesarean delivery. WOMEN WHO PLAN TO HAVE A VBAC SHOULD CHECK WITH THEIR DOCTOR TO BE SURE THAT:  The previous Cesarean was done with a low transverse uterine incision (not a vertical classical incision).   The birth canal is big enough for the baby.   There were no other operations on the uterus.   They will have an electronic fetal monitor (EFM) on at all times during labor.   An operating room would be available and ready in case an emergency Cesarean is needed.   A doctor and surgical nursing staff would be available at all times during labor to be ready to do an emergency Cesarean if necessary.   An anesthesiologist would be present in case an emergency Cesarean is needed.   The nursery is prepared and has adequate personnel and necessary equipment available to care for the baby in case of an emergency Cesarean.  BENEFITS OF VBAC:  Shorter stay in the hospital.   Lower delivery, nursery and hospital costs.   Less blood loss and need for blood transfusions.   Less fever and discomfort from major surgery.   Lower risk of blood clots.   Lower risk of infection.   Shorter recovery after going home.   Lower risk of other surgical complications, such as opening of the incision or hernia in the incision.   Decreased risk of injury to other organs.   Decreased risk for having to remove the uterus (hysterectomy).     Decreased risk for the placenta to completely or partially cover the opening of the uterus (placenta previa) with a future pregnancy.   Ability to have a larger family if desired.  RISKS OF A VBAC:  Rupture of the uterus.   Having to remove the uterus (hysterectomy) if it ruptures.   All the complications of major surgery and/or injury to other organs.   Excessive bleeding, blood clots and infection.   Lower Apgar scores (method to evaluate the newborn based on appearance, pulse, grimace, activity, and respiration) and more risks to the baby.   There is a higher risk of uterine rupture if you induce or augment labor.   There is a higher risk of uterine rupture if you use medications to ripen the cervix.  VBAC SHOULD NOT BE DONE IF:  The previous Cesarean was done with a vertical (classical) or T-shaped incision, or you do not know what kind of an incision was made.   You had a ruptured uterus.   You had surgery on your uterus.   You have medical or obstetrical problems.   There are problems with the baby.   There were two previous Cesarean deliveries and no vaginal deliveries.  OTHER FACTS TO KNOW ABOUT VBAC:  It is safe to have an epidural anesthetic with VBAC.   It is safe to turn the baby from a breech position (attempt an external cephalic version).   It is  safe to try a VBAC with twins.   Pregnancies later than 40 weeks have not been successful with VBAC.   There is an increased failure rate of a VBAC in obese pregnant women.   There is an increased failure rate with VABC if the baby weighs 8.8 pounds (4000 grams) or more.   There is an increased failure rate if the time between the Cesarean and VBAC is less than 19 months.   There is an increased failure rate if pre-eclampsia is present (high blood pressure, protein in the urine and swelling of face and extremities).   VBAC is very successful if there was a previous vaginal birth.   VBAC is very successful  when the labor starts spontaneously before the due date.   Delivery of VBAC is similar to having a normal spontaneous vaginal delivery.  It is important to discuss VBAC with your caregiver early in the pregnancy so you can understand the risks, benefits and options. It will give you time to decide what is best in your particular case relevant to the reason for your previous Cesarean delivery. It should be understood that medical changes in the mother or pregnancy may occur during the pregnancy, which make it necessary to change you or your caregiver's initial decision. The counseling, concerns and decisions should be documented in the medical record and signed by all parties. Document Released: 12/30/2006 Document Revised: 03/21/2011 Document Reviewed: 08/20/2008 [facility] Patient Information 2012 [place], [place].3 Breastfeeding BENEFITS OF BREASTFEEDING For the baby  The first milk (colostrum) helps the baby's digestive system function better.   There are antibodies from the mother in the milk that help the baby fight off infections.   The baby has a lower incidence of asthma, allergies, and SIDS (sudden infant death syndrome).   The nutrients in breast milk are better than formulas for the baby and helps the baby's brain grow better.   Babies who breastfeed have less gas, colic, and constipation.  For the mother  Breastfeeding helps develop a very special bond between mother and baby.   It is more convenient, always available at the correct temperature and cheaper than formula feeding.   It burns calories in the mother and helps with losing weight that was gained during pregnancy.   It makes the uterus contract back down to normal size faster and slows bleeding following delivery.   Breastfeeding mothers have a lower risk of developing breast cancer.  NURSE FREQUENTLY  A healthy, full-term baby may breastfeed as often as every hour or space his or her feedings to every 3 hours.    How often to nurse will vary from baby to baby. Watch your baby for signs of hunger, not the clock.   Nurse as often as the baby requests, or when you feel the need to reduce the fullness of your breasts.   Awaken the baby if it has been 3 to 4 hours since the last feeding.   Frequent feeding will help the mother make more milk and will prevent problems like sore nipples and engorgement of the breasts.  BABY'S POSITION AT THE BREAST  Whether lying down or sitting, be sure that the baby's tummy is facing your tummy.   Support the breast with 4 fingers underneath the breast and the thumb above. Make sure your fingers are well away from the nipple and baby's mouth.   Stroke the baby's lips and cheek closest to the breast gently with your finger or nipple.   When the  baby's mouth is open wide enough, place all of your nipple and as much of the dark area around the nipple as possible into your baby's mouth.   Pull the baby in close so the tip of the nose and the baby's cheeks touch the breast during the feeding.  FEEDINGS  The length of each feeding varies from baby to baby and from feeding to feeding.   The baby must suck about 2 to 3 minutes for your milk to get to him or her. This is called a "let down." For this reason, allow the baby to feed on each breast as long as he or she wants. Your baby will end the feeding when he or she has received the right balance of nutrients.   To break the suction, put your finger into the corner of the baby's mouth and slide it between his or her gums before removing your breast from his or her mouth. This will help prevent sore nipples.  REDUCING BREAST ENGORGEMENT  In the first week after your baby is born, you may experience signs of breast engorgement. When breasts are engorged, they feel heavy, warm, full, and may be tender to the touch. You can reduce engorgement if you:   Nurse frequently, every 2 to 3 hours. Mothers who breastfeed early and  often have fewer problems with engorgement.   Place light ice packs on your breasts between feedings. This reduces swelling. Wrap the ice packs in a lightweight towel to protect your skin.   Apply moist hot packs to your breast for 5 to 10 minutes before each feeding. This increases circulation and helps the milk flow.   Gently massage your breast before and during the feeding.   Make sure that the baby empties at least one breast at every feeding before switching sides.   Use a breast pump to empty the breasts if your baby is sleepy or not nursing well. You may also want to pump if you are returning to work or or you feel you are getting engorged.   Avoid bottle feeds, pacifiers or supplemental feedings of water or juice in place of breastfeeding.   Be sure the baby is latched on and positioned properly while breastfeeding.   Prevent fatigue, stress, and anemia.   Wear a supportive bra, avoiding underwire styles.   Eat a balanced diet with enough fluids.  If you follow these suggestions, your engorgement should improve in 24 to 48 hours. If you are still experiencing difficulty, call your lactation consultant or caregiver. IS MY BABY GETTING ENOUGH MILK? Sometimes, mothers worry about whether their babies are getting enough milk. You can be assured that your baby is getting enough milk if:  The baby is actively sucking and you hear swallowing.   The baby nurses at least 8 to 12 times in a 24 hour time period. Nurse your baby until he or she unlatches or falls asleep at the first breast (at least 10 to 20 minutes), then offer the second side.   The baby is wetting 5 to 6 disposable diapers (6 to 8 cloth diapers) in a 24 hour period by 47 to 57 days of age.   The baby is having at least 2 to 3 stools every 24 hours for the first few months. Breast milk is all the food your baby needs. It is not necessary for your baby to have water or formula. In fact, to help your breasts make more milk,  it is best not to  give your baby supplemental feedings during the early weeks.   The stool should be soft and yellow.   The baby should gain 4 to 7 ounces per week after he is 28 days old.  TAKE CARE OF YOURSELF Take care of your breasts by:  Bathing or showering daily.   Avoiding the use of soaps on your nipples.   Start feedings on your left breast at one feeding and on your right breast at the next feeding.   You will notice an increase in your milk supply 2 to 5 days after delivery. You may feel some discomfort from engorgement, which makes your breasts very firm and often tender. Engorgement "peaks" out within 24 to 48 hours. In the meantime, apply warm moist towels to your breasts for 5 to 10 minutes before feeding. Gentle massage and expression of some milk before feeding will soften your breasts, making it easier for your baby to latch on. Wear a well fitting nursing bra and air dry your nipples for 10 to 15 minutes after each feeding.   Only use cotton bra pads.   Only use pure lanolin on your nipples after nursing. You do not need to wash it off before nursing.  Take care of yourself by:   Eating well-balanced meals and nutritious snacks.   Drinking milk, fruit juice, and water to satisfy your thirst (about 8 glasses a day).   Getting plenty of rest.   Increasing calcium in your diet (1200 mg a day).   Avoiding foods that you notice affect the baby in a bad way.  SEEK MEDICAL CARE IF:   You have any questions or difficulty with breastfeeding.   You need help.   You have a hard, red, sore area on your breast, accompanied by a fever of 100.5 F (38.1 C) or more.   Your baby is too sleepy to eat well or is having trouble sleeping.   Your baby is wetting less than 6 diapers per day, by 41 days of age.   Your baby's skin or white part of his or her eyes is more yellow than it was in the hospital.   You feel depressed.  Document Released: 07/09/2005 Document Revised:  03/21/2011 Document Reviewed: 02/21/2009 Iron Deficiency Anemia There are many types of anemia. Iron deficiency anemia is the most common. Iron deficiency anemia is a decrease in the number of red blood cells caused by too little iron. Without enough iron, your body does not produce enough hemoglobin. Hemoglobin is a substance in red blood cells that carries oxygen to the body's tissues. Iron deficiency anemia may leave you tired and short of breath. CAUSES   Lack of iron in the diet.   This may be seen in infants and children, because there is little iron in milk.   This may be seen in adults who do not eat enough iron-rich foods.   This may be seen in pregnant or breastfeeding women who do not take iron supplements. There is a much higher need for iron intake at these times.   Poor absorption of iron, as seen with intestinal disorders.   Intestinal bleeding.   Heavy periods.  SYMPTOMS  Mild anemia may not be noticeable. Symptoms may include:  Fatigue.   Headache.   Pale skin.   Weakness.   Shortness of breath.   Dizziness.   Cold hands and feet.   Fast or irregular heartbeat.  DIAGNOSIS  Diagnosis requires a thorough evaluation and physical exam by your  caregiver.  Blood tests are generally used to confirm iron deficiency anemia.   Additional tests may be done to find the underlying cause of your anemia. These may include:   Testing for blood in the stool (fecal occult blood test).   A procedure to see inside the colon and rectum (colonoscopy).   A procedure to see inside the esophagus and stomach (endoscopy).  TREATMENT   Correcting the cause of the iron deficiency is the first step.   Medicines, such as oral contraceptives, can make heavy menstrual flows lighter.   Antibiotics and other medicines can be used to treat peptic ulcers.   Surgery may be needed to remove a bleeding polyp, tumor, or fibroid.   Often, iron supplements (ferrous sulfate) are taken.    For the best iron absorption, take these supplements with an empty stomach.   You may need to take the supplements with food if you cannot tolerate them on an empty stomach. Vitamin C improves the absorption of iron. Your caregiver may recommend taking your iron tablets with a glass of orange juice or vitamin C supplement.   Milk and antacids should not be taken at the same time as iron supplements. They may interfere with the absorption of iron.   Iron supplements can cause constipation. A stool softener is often recommended.   Pregnant and breastfeeding women will need to take extra iron, because their normal diet usually will not provide the required amount.   Patients who cannot tolerate iron by mouth can take it through a vein (intravenously) or by an injection into the muscle.  HOME CARE INSTRUCTIONS   Ask your dietitian for help with diet questions.   Take iron and vitamins as directed by your caregiver.   Eat a diet rich in iron. Eat liver, lean beef, whole-grain bread, eggs, dried fruit, and dark green leafy vegetables.  SEEK IMMEDIATE MEDICAL CARE IF:   You have a fainting episode. Do not drive yourself. Call your local emergency services (911 in [place] if no other help is available.   You have chest pain, nausea, or vomiting.   You develop severe or increased shortness of breath with activities.   You develop weakness or increased thirst.   You have a rapid heartbeat.   You develop unexplained sweating or become lightheaded when getting up from a chair or bed.  MAKE SURE YOU:   Understand these instructions.   Will watch your condition.   Will get help right away if you are not doing well or get worse.  Document Released: 07/06/2000 Document Revised: 03/21/2011 Document Reviewed: 11/15/2009 [facility] Patient Information 2012 [place], [place]. [employer] Patient Information 2012 [place], [place].

## 2011-10-03 ENCOUNTER — Ambulatory Visit (INDEPENDENT_AMBULATORY_CARE_PROVIDER_SITE_OTHER): Payer: Federal, State, Local not specified - PPO | Admitting: Obstetrics and Gynecology

## 2011-10-30 ENCOUNTER — Ambulatory Visit (INDEPENDENT_AMBULATORY_CARE_PROVIDER_SITE_OTHER): Payer: Federal, State, Local not specified - PPO | Admitting: Obstetrics and Gynecology

## 2011-10-30 ENCOUNTER — Encounter: Payer: Self-pay | Admitting: Obstetrics and Gynecology

## 2011-10-30 VITALS — BP 100/64 | Temp 97.9°F | Wt 178.0 lb

## 2011-10-30 DIAGNOSIS — N61 Mastitis without abscess: Secondary | ICD-10-CM

## 2011-10-30 MED ORDER — CEPHALEXIN 500 MG PO CAPS: 500.0000 mg | ORAL_CAPSULE | Freq: Four times a day (QID) | ORAL | Status: AC

## 2011-10-30 NOTE — Patient Instructions (Signed)
Apply warm compresses to right breast for 15 minutes 4 times a day Take all of antibiotics until completed Call if symptoms worsen

## 2011-10-30 NOTE — Progress Notes (Signed)
Pt. Complaining of myalgias 3 days ago and a red right breast spot that was painful to touch.  Denies fever but has been taking OTC antipyretic.  Myagias have resolved but area of pain remains.  Breast feeds on demand (about 10 times a day).  O: Right breast with area of tenderness between 2 and 3 o'clock that is slightly red. No fluctuance, drainage, pointing or adenopathy.  Left breast without dominant nodules, tenderness, skin changes, discharge or adenopathy.  A: Right mastitis  P: Warm compresses      Cephalexin 500 mg qid x 7 days 0 refills

## 2012-05-28 ENCOUNTER — Other Ambulatory Visit: Payer: Self-pay | Admitting: Otolaryngology

## 2012-05-28 DIAGNOSIS — R1310 Dysphagia, unspecified: Secondary | ICD-10-CM

## 2012-05-28 DIAGNOSIS — R220 Localized swelling, mass and lump, head: Secondary | ICD-10-CM

## 2012-06-04 ENCOUNTER — Other Ambulatory Visit: Payer: Federal, State, Local not specified - PPO

## 2012-06-05 ENCOUNTER — Telehealth: Payer: Self-pay | Admitting: Obstetrics and Gynecology

## 2012-06-05 NOTE — Telephone Encounter (Signed)
Spoke with pt rgd cycle pt wants to know if nl to have irreg cycles while breast feeding advised pt nl to to have irreg cycle while breast feeding pt voic eunderstanding

## 2012-06-06 ENCOUNTER — Ambulatory Visit
Admission: RE | Admit: 2012-06-06 | Discharge: 2012-06-06 | Disposition: A | Discharge status: Home / Self Care | Payer: Federal, State, Local not specified - PPO | Source: Ambulatory Visit | Attending: Otolaryngology | Admitting: Otolaryngology

## 2012-06-06 DIAGNOSIS — R220 Localized swelling, mass and lump, head: Secondary | ICD-10-CM

## 2012-06-06 DIAGNOSIS — R221 Localized swelling, mass and lump, neck: Secondary | ICD-10-CM

## 2012-06-06 DIAGNOSIS — R1310 Dysphagia, unspecified: Secondary | ICD-10-CM

## 2012-07-23 NOTE — L&D Delivery Note (Signed)
Delivery Note At 3:53 AM a viable female, "[name]", was delivered via Vaginal, Spontaneous Delivery as a waterbirth (Presentation: Left Occiput Anterior).  APGAR: 8, 9; weight .   Placenta status: Intact, Spontaneous.  Cord: 3 vessels with the following complications: None.  Cord pH: NA. Cord noted around left leg.  Anesthesia: Local for repair Episiotomy: None Lacerations: Bilateral periurethral lacerations, 1st degree.  Left side repaired with 2 sutures for hemostasis Suture Repair: 4-0 vicryl Est. Blood Loss (mL): 200 cc  Mom to postpartum.  Baby to skin to skin.Marland Kitchen  Nigel Bridgeman 03/31/2013, 4:46 AM

## 2012-07-28 ENCOUNTER — Ambulatory Visit: Payer: Federal, State, Local not specified - PPO | Admitting: Obstetrics and Gynecology

## 2012-07-30 ENCOUNTER — Ambulatory Visit (INDEPENDENT_AMBULATORY_CARE_PROVIDER_SITE_OTHER): Payer: Federal, State, Local not specified - PPO

## 2012-07-30 DIAGNOSIS — B951 Streptococcus, group B, as the cause of diseases classified elsewhere: Secondary | ICD-10-CM

## 2012-07-30 DIAGNOSIS — O2390 Unspecified genitourinary tract infection in pregnancy, unspecified trimester: Secondary | ICD-10-CM

## 2012-07-30 DIAGNOSIS — N390 Urinary tract infection, site not specified: Secondary | ICD-10-CM

## 2012-07-30 DIAGNOSIS — O2620 Pregnancy care for patient with recurrent pregnancy loss, unspecified trimester: Secondary | ICD-10-CM

## 2012-07-30 DIAGNOSIS — Z363 Encounter for antenatal screening for malformations: Secondary | ICD-10-CM

## 2012-07-30 DIAGNOSIS — Z331 Pregnant state, incidental: Secondary | ICD-10-CM

## 2012-07-30 DIAGNOSIS — Z1389 Encounter for screening for other disorder: Secondary | ICD-10-CM

## 2012-07-30 DIAGNOSIS — B955 Unspecified streptococcus as the cause of diseases classified elsewhere: Secondary | ICD-10-CM

## 2012-07-30 LAB — OB RESULTS CONSOLE GBS: GBS: POSITIVE

## 2012-07-30 NOTE — Progress Notes (Signed)
NOB int complete.  Pt reports no problems but is requesting u/s due to her MAB history.  Will consult with AVS for this.   OK to order u/s with NOB w/u per AVS for viability.  Will call pt to confirm appt for NOB. PNL done and urine sent for culture. Chem 10 SG 1.010 PH 5 All else neg.

## 2012-07-31 ENCOUNTER — Other Ambulatory Visit: Payer: Self-pay | Admitting: Obstetrics and Gynecology

## 2012-07-31 ENCOUNTER — Ambulatory Visit (INDEPENDENT_AMBULATORY_CARE_PROVIDER_SITE_OTHER): Payer: Federal, State, Local not specified - PPO | Admitting: Obstetrics and Gynecology

## 2012-07-31 ENCOUNTER — Ambulatory Visit (INDEPENDENT_AMBULATORY_CARE_PROVIDER_SITE_OTHER): Payer: Federal, State, Local not specified - PPO

## 2012-07-31 ENCOUNTER — Encounter: Payer: Self-pay | Admitting: Obstetrics and Gynecology

## 2012-07-31 VITALS — BP 108/70 | Wt 172.0 lb

## 2012-07-31 DIAGNOSIS — Z363 Encounter for antenatal screening for malformations: Secondary | ICD-10-CM

## 2012-07-31 DIAGNOSIS — Z1389 Encounter for screening for other disorder: Secondary | ICD-10-CM

## 2012-07-31 DIAGNOSIS — Z331 Pregnant state, incidental: Secondary | ICD-10-CM

## 2012-07-31 DIAGNOSIS — O3680X0 Pregnancy with inconclusive fetal viability, not applicable or unspecified: Secondary | ICD-10-CM

## 2012-07-31 DIAGNOSIS — Z8759 Personal history of other complications of pregnancy, childbirth and the puerperium: Secondary | ICD-10-CM

## 2012-07-31 DIAGNOSIS — O09299 Supervision of pregnancy with other poor reproductive or obstetric history, unspecified trimester: Secondary | ICD-10-CM

## 2012-07-31 DIAGNOSIS — O2620 Pregnancy care for patient with recurrent pregnancy loss, unspecified trimester: Secondary | ICD-10-CM

## 2012-07-31 DIAGNOSIS — O039 Complete or unspecified spontaneous abortion without complication: Secondary | ICD-10-CM | POA: Insufficient documentation

## 2012-07-31 DIAGNOSIS — B009 Herpesviral infection, unspecified: Secondary | ICD-10-CM

## 2012-07-31 LAB — POCT WET PREP (WET MOUNT)
Bacteria Wet Prep HPF POC: NEGATIVE
Clue Cells Wet Prep Whiff POC: NEGATIVE
Trichomonas Wet Prep HPF POC: NEGATIVE
WBC, Wet Prep HPF POC: NEGATIVE

## 2012-07-31 LAB — PRENATAL PANEL VII
Antibody Screen: NEGATIVE
Basophils Absolute: 0 10*3/uL (ref 0.0–0.1)
Basophils Relative: 0 % (ref 0–1)
Eosinophils Absolute: 0.1 10*3/uL (ref 0.0–0.7)
Eosinophils Relative: 1 % (ref 0–5)
HCT: 39.9 % (ref 36.0–46.0)
HIV: NONREACTIVE
Hemoglobin: 14 g/dL (ref 12.0–15.0)
Hepatitis B Surface Ag: NEGATIVE
Lymphocytes Relative: 31 % (ref 12–46)
Lymphs Abs: 2.8 10*3/uL (ref 0.7–4.0)
MCH: 30.5 pg (ref 26.0–34.0)
MCHC: 35.1 g/dL (ref 30.0–36.0)
MCV: 86.9 fL (ref 78.0–100.0)
Monocytes Absolute: 0.7 10*3/uL (ref 0.1–1.0)
Monocytes Relative: 8 % (ref 3–12)
Neutro Abs: 5.3 10*3/uL (ref 1.7–7.7)
Neutrophils Relative %: 60 % (ref 43–77)
Platelets: 271 10*3/uL (ref 150–400)
RBC: 4.59 MIL/uL (ref 3.87–5.11)
RDW: 13.7 % (ref 11.5–15.5)
Rh Type: POSITIVE
Rubella: 18.3 Index — ABNORMAL HIGH (ref <0.90)
WBC: 9 10*3/uL (ref 4.0–10.5)

## 2012-07-31 LAB — US OB COMP LESS 14 WKS

## 2012-07-31 MED ORDER — PENCICLOVIR 1 % EX CREA: TOPICAL_CREAM | CUTANEOUS | Status: DC

## 2012-07-31 NOTE — Progress Notes (Signed)
   [name] is being seen today for her first obstetrical visit at [redacted]w[redacted]d gestation by LMP. [place] schedule for dating and viability due to 5 week cycles  She reports a rash x 2 weeks on left side of her body - saw [facility], treated with hydrocortisone and Claritin. Pt reports its getting better. She also reports increase in fever blisters recently.  Her obstetrical history is significant for: Patient Active Problem List  Diagnosis  . VBAC (vaginal birth after Cesarean)  . 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  The following portions of the patient's history were reviewed and updated as appropriate: allergies, current medications, past family history, past medical history, past social history, past surgical history and problem list.  Review of Systems Pertinent ROS is described in HPI   Objective:   BP 108/70  Wt 172 lb (78.019 kg)  LMP 06/02/2012  Breastfeeding? Yes Wt Readings from Last 1 Encounters:  07/31/12 172 lb (78.019 kg)   BMI: There is no height on file to calculate BMI.  General: alert, cooperative and no distress HEENT: grossly normal  Thyroid: normal  Respiratory: clear to auscultation bilaterally Cardiovascular: regular rate and rhythm,  Breasts:  No dominant masses, nipples erect Gastrointestinal: soft, non-tender; no masses,  no organomegaly Extremities: extremities normal, no pain or edema Vaginal Bleeding: None  EXTERNAL GENITALIA: normal appearing vulva with no masses, tenderness or lesions VAGINA: no abnormal discharge or lesions CERVIX: no lesions or cervical motion tenderness; cervix closed, long, firm UTERUS: gravid and consistent with 8 weeks ADNEXA: no masses palpable and nontender OB EXAM PELVIMETRY: appears adequate  FHR:  Deferred to [place] today [place] today: [facility] [redacted]w[redacted]d, [facility] 03/22/13 Sm  [facility]  Assessment:    Pregnancy at 6w 4d  Pervious CS with subsequent VBAC Plans waterbirth (Previous waterbirth) Hx of recurrent miscarriage Close spacing pregnancies  Plan:     Prenatal panel reviewed and discussed with the patient:  yes  Pap smear collected:  yes GC/Chlamydia collected:  yes Wet prep:  neg Discussion of Genetic testing options: may want 1st trimester screening - will discuss at nv Prenatal vitamins recommended  Plan of care: Next visit:  Return in 2 weeks for another [place] for reassurrance due to recent missed SABs Other anticipated f/u: Pt requests to take low dose aspirin due to hx of SABs - ok'd   Nigel Bridgeman, CNM Haroldine Laws, CNM

## 2012-07-31 NOTE — Progress Notes (Signed)
Pt is here today for her NOB work-up. Pt stated last pap was a year ago wnl per pt. Pt stated no issues today.

## 2012-08-02 LAB — PAP IG, CT-NG, RFX HPV ASCU
Chlamydia Probe Amp: NEGATIVE
GC Probe Amp: NEGATIVE

## 2012-08-02 LAB — CULTURE, OB URINE: Colony Count: 3000

## 2012-08-05 DIAGNOSIS — B955 Unspecified streptococcus as the cause of diseases classified elsewhere: Secondary | ICD-10-CM | POA: Insufficient documentation

## 2012-08-05 LAB — US OB TRANSVAGINAL

## 2012-08-14 ENCOUNTER — Ambulatory Visit: Payer: Federal, State, Local not specified - PPO

## 2012-08-14 ENCOUNTER — Other Ambulatory Visit: Payer: Federal, State, Local not specified - PPO

## 2012-08-14 ENCOUNTER — Encounter: Payer: Federal, State, Local not specified - PPO | Admitting: Obstetrics and Gynecology

## 2012-08-14 ENCOUNTER — Ambulatory Visit: Payer: Federal, State, Local not specified - PPO | Admitting: Obstetrics and Gynecology

## 2012-08-14 VITALS — BP 110/60 | Wt 172.0 lb

## 2012-08-14 DIAGNOSIS — Z331 Pregnant state, incidental: Secondary | ICD-10-CM

## 2012-08-14 DIAGNOSIS — Z8759 Personal history of other complications of pregnancy, childbirth and the puerperium: Secondary | ICD-10-CM

## 2012-08-14 DIAGNOSIS — O3680X0 Pregnancy with inconclusive fetal viability, not applicable or unspecified: Secondary | ICD-10-CM

## 2012-08-14 DIAGNOSIS — B3731 Acute candidiasis of vulva and vagina: Secondary | ICD-10-CM

## 2012-08-14 DIAGNOSIS — B373 Candidiasis of vulva and vagina: Secondary | ICD-10-CM

## 2012-08-14 LAB — US OB LIMITED

## 2012-08-14 MED ORDER — TERCONAZOLE 0.4 % VA CREA: 1.0000 | TOPICAL_CREAM | Freq: Every day | VAGINAL | Status: DC

## 2012-08-14 NOTE — Progress Notes (Signed)
[redacted]w[redacted]d  Pt has no concerns today. Pt will leave sample before leaving the office.

## 2012-08-14 NOTE — Progress Notes (Signed)
Patient ID: [name], female   DOB: [birth year], 32 y.o.   MRN: [id number] [redacted]w[redacted]d [place] reveiwed. PAP with yeast rx terazol and baking soda bathes, rash resolved with exception 4 cm and 1 cm area on forearm. F/o NOB Lavera Guise, CNM

## 2012-08-28 IMAGING — CT CT HEAD W/O CM
1 of 2 series · 13 of 30 positions shown, 17 images · non-contrast
Comparison: None

CLINICAL DATA: Visual disturbance.

CT HEAD WITHOUT CONTRAST
TECHNIQUE: Contiguous axial images were obtained from the base of
the skull through the vertex without contrast.

[Series 2: brain · axial · 0.47mm/px · z∈[+110,+234]mm · 13 of 28 slices shown, 17 images]
[im 2/28  brain]
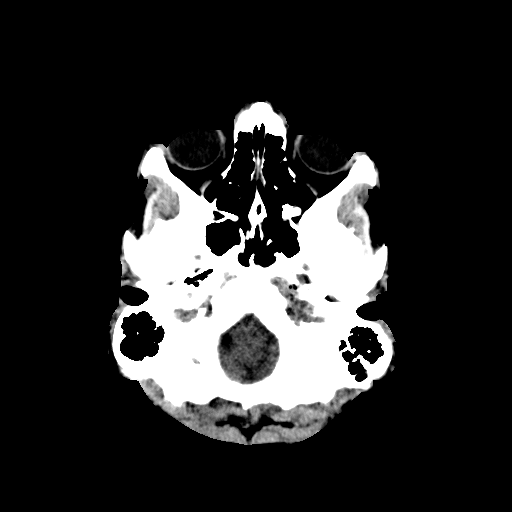
[im 2/28  bone]
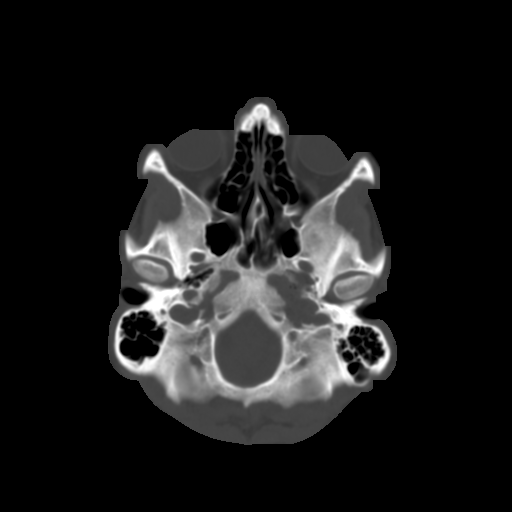
[im 4/28  brain]
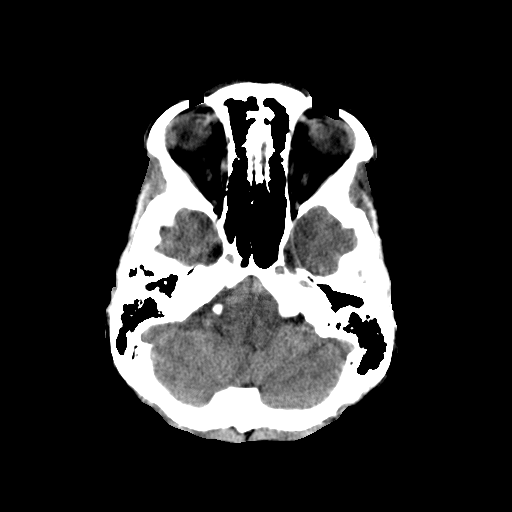
[im 6/28  brain]
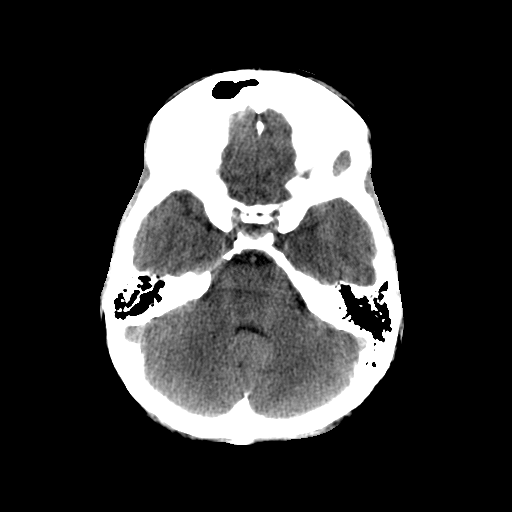
[im 8/28  brain]
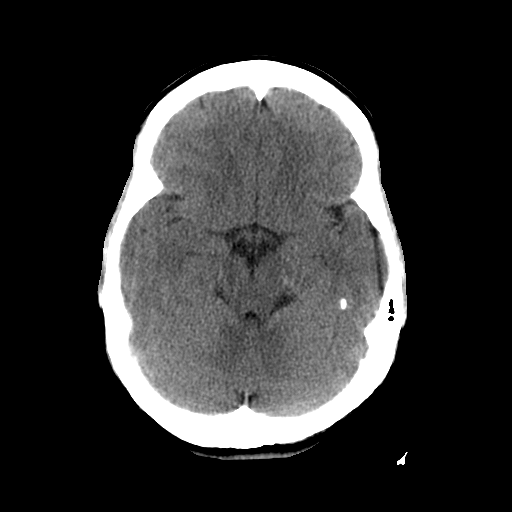
[im 10/28  brain]
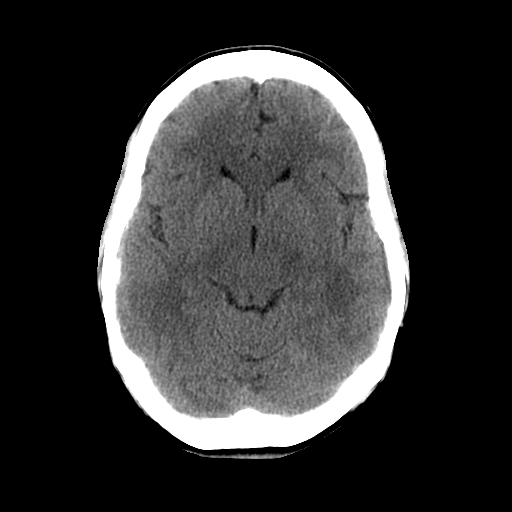
[im 10/28  bone]
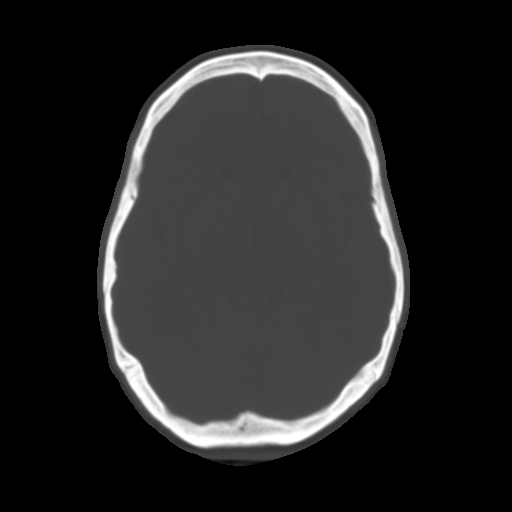
[im 12/28  brain]
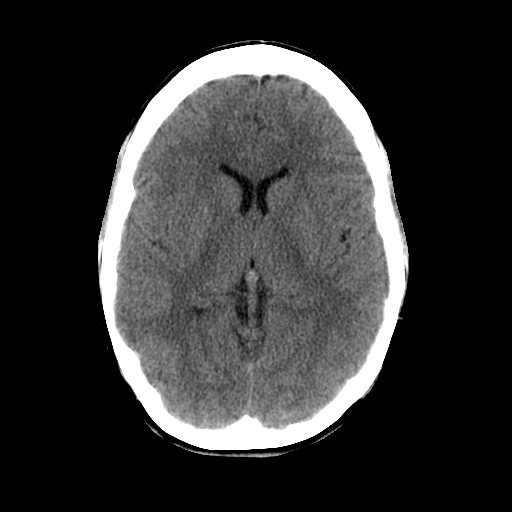
[im 14/28  brain]
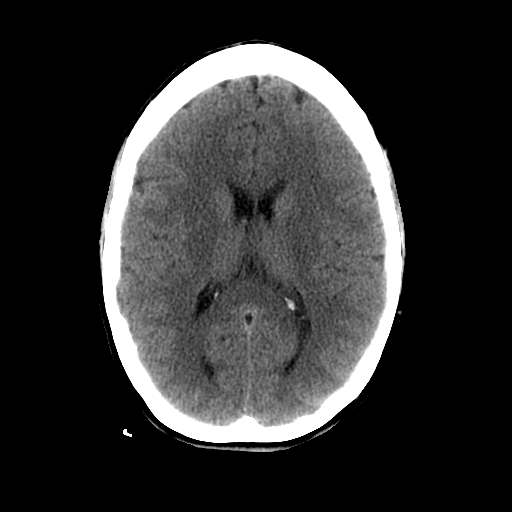
[im 16/28  brain]
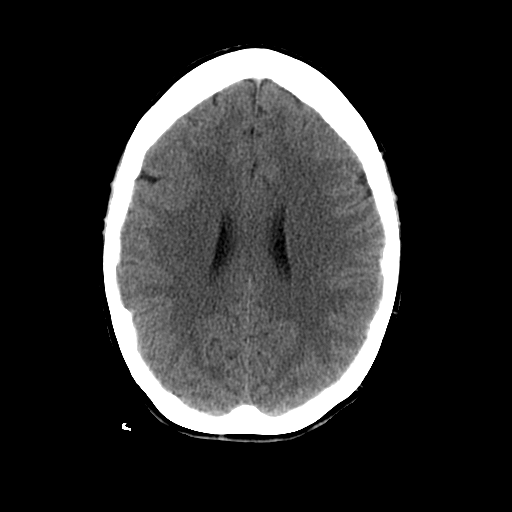
[im 18/28  brain]
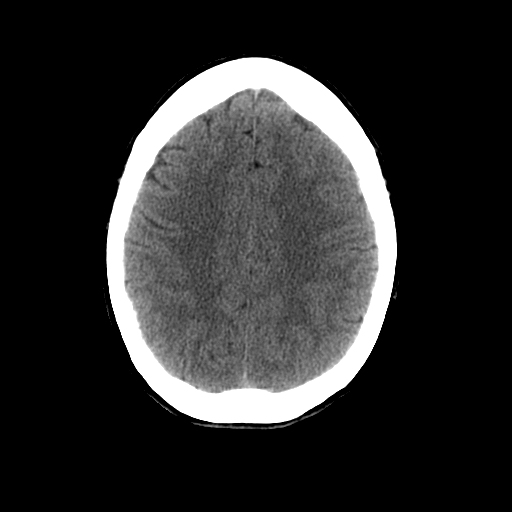
[im 18/28  bone]
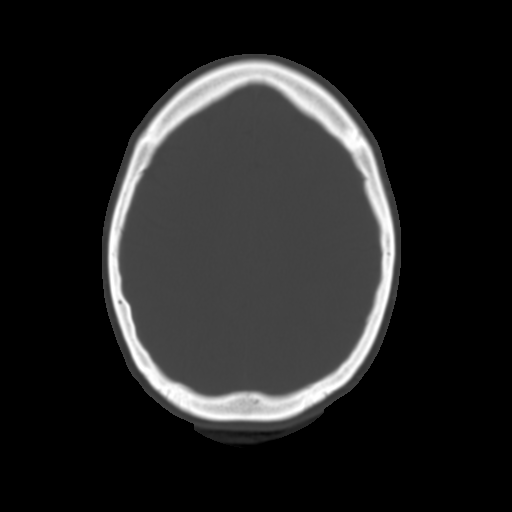
[im 20/28  brain]
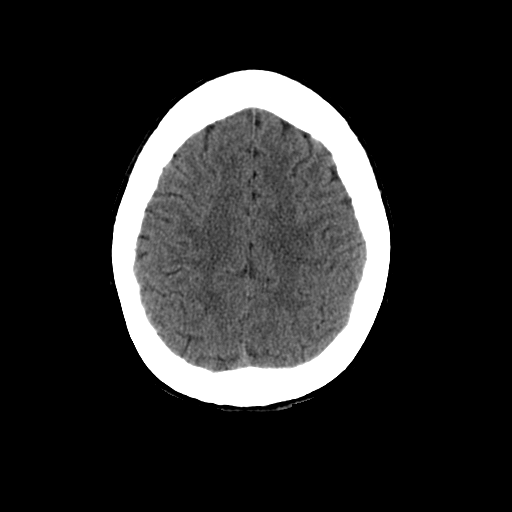
[im 22/28  brain]
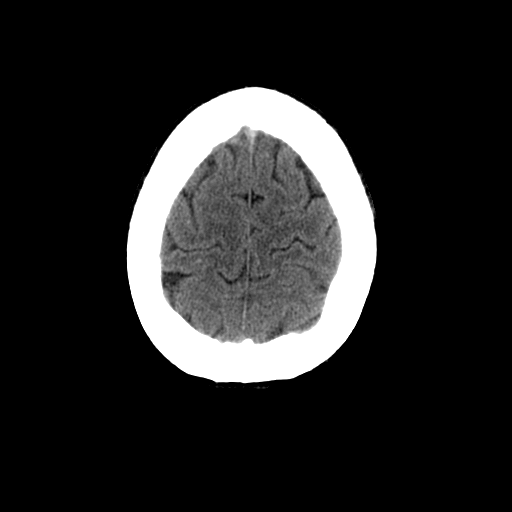
[im 24/28  brain]
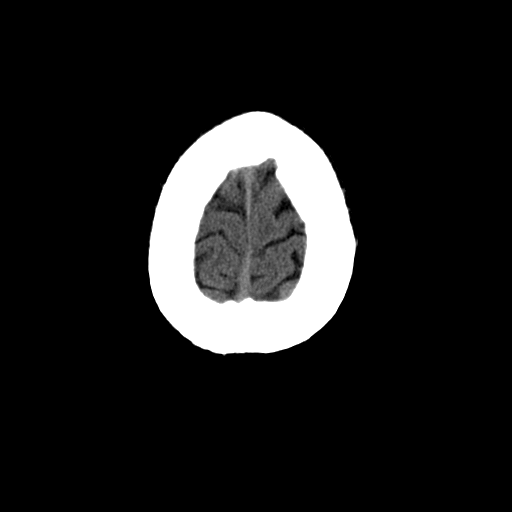
[im 26/28  brain]
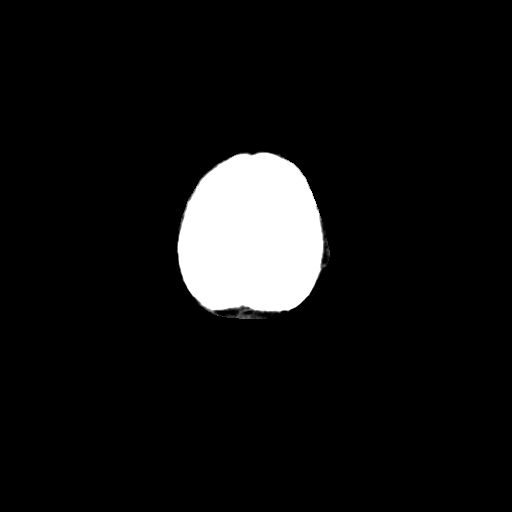
[im 26/28  bone]
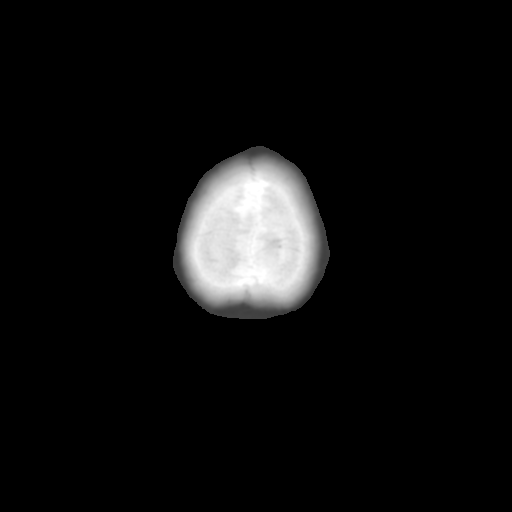

[13 of 30 positions shown; findings below may reference images not displayed]

FINDINGS: The ventricles are normal.  No extra-axial fluid
collections are seen.  The brainstem and cerebellum are
unremarkable.  No acute intracranial findings such as infarction or
hemorrhage.  No mass lesions.

The bony calvarium is intact.  The visualized paranasal sinuses and
mastoid air cells are clear.

The globes appear normal.
IMPRESSION: No acute intracranial findings or mass lesions.

## 2012-08-29 ENCOUNTER — Encounter: Payer: Self-pay | Admitting: Obstetrics and Gynecology

## 2012-08-29 ENCOUNTER — Ambulatory Visit: Payer: Federal, State, Local not specified - PPO | Admitting: Obstetrics and Gynecology

## 2012-08-29 VITALS — BP 92/60 | Wt 170.0 lb

## 2012-08-29 DIAGNOSIS — Z331 Pregnant state, incidental: Secondary | ICD-10-CM

## 2012-08-29 NOTE — Progress Notes (Signed)
[redacted]w[redacted]d Is weaning off BF Desires 1st trimester screen

## 2012-08-29 NOTE — Progress Notes (Signed)
[redacted]w[redacted]d Pt has no complaints today

## 2012-09-10 ENCOUNTER — Other Ambulatory Visit: Payer: Self-pay

## 2012-09-10 DIAGNOSIS — Z36 Encounter for antenatal screening of mother: Secondary | ICD-10-CM

## 2012-09-12 ENCOUNTER — Other Ambulatory Visit: Payer: Self-pay | Admitting: Obstetrics and Gynecology

## 2012-09-12 ENCOUNTER — Ambulatory Visit: Payer: Federal, State, Local not specified - PPO

## 2012-09-12 DIAGNOSIS — Z36 Encounter for antenatal screening of mother: Secondary | ICD-10-CM

## 2012-09-12 LAB — US OB COMP LESS 14 WKS

## 2012-09-17 ENCOUNTER — Ambulatory Visit: Payer: Federal, State, Local not specified - PPO

## 2012-09-17 VITALS — BP 100/60 | Wt 170.0 lb

## 2012-09-17 DIAGNOSIS — Z3490 Encounter for supervision of normal pregnancy, unspecified, unspecified trimester: Secondary | ICD-10-CM

## 2012-09-17 NOTE — Progress Notes (Signed)
[redacted]w[redacted]d Pt declines wet prep; will get Monistat 3 or 7.  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  Pt desires to know sex of baby (confidentially).  Still BF'ng Noa w/o issues but thinks will wean as time goes.  Unplanned pregnancy but excited and desired. Well otherwise. 1st trimester screen WNL. Anatomy and AFP visit after next.

## 2012-09-17 NOTE — Progress Notes (Signed)
[redacted]w[redacted]d  Pt c/o : yeast , pt was told to do a 3 day treatment but was not sure if it was OTC  Pt declines exam

## 2012-10-15 ENCOUNTER — Other Ambulatory Visit: Payer: Self-pay | Admitting: Obstetrics and Gynecology

## 2012-10-16 LAB — ALPHA FETOPROTEIN, MATERNAL
AFP: 35.9 IU/mL
Curr Gest Age: 17.4 wks.days
MoM for AFP: 1.03
Open Spina bifida: NEGATIVE
Osb Risk: 1:12100 {titer}

## 2013-03-30 ENCOUNTER — Inpatient Hospital Stay (HOSPITAL_COMMUNITY)
Admission: AD | Admit: 2013-03-30 | Discharge: 2013-04-01 | DRG: 373 | Disposition: A | Discharge status: Home / Self Care | Payer: Federal, State, Local not specified - PPO | Source: Ambulatory Visit | Attending: Obstetrics and Gynecology | Admitting: Obstetrics and Gynecology

## 2013-03-30 DIAGNOSIS — Z22330 Carrier of Group B streptococcus: Secondary | ICD-10-CM

## 2013-03-30 DIAGNOSIS — O3660X0 Maternal care for excessive fetal growth, unspecified trimester, not applicable or unspecified: Secondary | ICD-10-CM | POA: Diagnosis present

## 2013-03-30 DIAGNOSIS — IMO0001 Reserved for inherently not codable concepts without codable children: Secondary | ICD-10-CM

## 2013-03-30 DIAGNOSIS — O34219 Maternal care for unspecified type scar from previous cesarean delivery: Principal | ICD-10-CM | POA: Diagnosis present

## 2013-03-30 DIAGNOSIS — O99892 Other specified diseases and conditions complicating childbirth: Secondary | ICD-10-CM | POA: Diagnosis present

## 2013-03-30 DIAGNOSIS — IMO0002 Reserved for concepts with insufficient information to code with codable children: Secondary | ICD-10-CM | POA: Diagnosis not present

## 2013-03-30 MED ORDER — OXYTOCIN 40 UNITS IN LACTATED RINGERS INFUSION - SIMPLE MED
62.5000 mL/h | INTRAVENOUS | Status: DC
  Filled 2013-03-30: qty 1000

## 2013-03-30 MED ORDER — OXYTOCIN BOLUS FROM INFUSION: 500.0000 mL | INTRAVENOUS | Status: DC

## 2013-03-30 MED ORDER — OXYCODONE-ACETAMINOPHEN 5-325 MG PO TABS: 1.0000 | ORAL_TABLET | ORAL | Status: DC | PRN

## 2013-03-30 MED ORDER — LACTATED RINGERS IV SOLN: 500.0000 mL | INTRAVENOUS | Status: DC | PRN

## 2013-03-30 MED ORDER — IBUPROFEN 600 MG PO TABS
600.0000 mg | ORAL_TABLET | Freq: Four times a day (QID) | ORAL | Status: DC | PRN
  Administered 2013-03-31: 600 mg via ORAL
  Filled 2013-03-30: qty 1

## 2013-03-30 MED ORDER — ACETAMINOPHEN 325 MG PO TABS: 650.0000 mg | ORAL_TABLET | ORAL | Status: DC | PRN

## 2013-03-30 MED ORDER — LACTATED RINGERS IV SOLN
INTRAVENOUS | Status: DC
  Administered 2013-03-31: via INTRAVENOUS

## 2013-03-30 MED ORDER — CITRIC ACID-SODIUM CITRATE 334-500 MG/5ML PO SOLN: 30.0000 mL | ORAL | Status: DC | PRN

## 2013-03-30 MED ORDER — FLEET ENEMA 7-19 GM/118ML RE ENEM: 1.0000 | ENEMA | RECTAL | Status: DC | PRN

## 2013-03-30 MED ORDER — SODIUM CHLORIDE 0.9 % IV SOLN
2.0000 g | Freq: Once | INTRAVENOUS | Status: AC
  Administered 2013-03-31: 2 g via INTRAVENOUS
  Filled 2013-03-30: qty 2000

## 2013-03-30 MED ORDER — ONDANSETRON HCL 4 MG/2ML IJ SOLN: 4.0000 mg | Freq: Four times a day (QID) | INTRAMUSCULAR | Status: DC | PRN

## 2013-03-30 MED ORDER — LIDOCAINE HCL (PF) 1 % IJ SOLN
30.0000 mL | INTRAMUSCULAR | Status: DC | PRN
  Administered 2013-03-31: 30 mL via SUBCUTANEOUS
  Filled 2013-03-30 (×2): qty 30

## 2013-03-30 NOTE — H&P (Signed)
[name] is a 32 y.o. female, [phone] presenting with contractions of increasing frequency and intensity since 9:50pm.  Denies leaking of fluid, but has noted some bloody show.  Reports +FM, cervix was 3 cm at visit last week.     History of present pregnancy: Patient entered care at 8 3/7 weeks.   EDC of 03/22/13 was established by LMP and confirmed by 1st trimester [place] at 6 4/7 weeks. Anatomy scan:  22 weeks, with normal findings, but incomplete anatomy, and an posterior placenta.   Additional [place] evaluations:  23-24 weeks for f/u anatomy Significant prenatal events:  Rash on right side of body, occurred prior to NOB visit--resolved during pregnancy with topical hydrocortisone (seen by [facility] primary office) Last evaluation:  03/27/13, with cervix 3 cm, 50%, vtx, -1.  Declined 3rd trimester [place] for EFW.  Patient Active Problem List   Diagnosis Date Noted  . Previous cesarean delivery, antepartum condition or complication, with subsequent VBAC 03/31/2013  . Active labor 03/31/2013  . LGA (large for gestational age) fetus with last delivery 03/31/2013  . Beta hemolytic strep UTI complicating pregnancy 08/05/2012  . SAB (spontaneous abortion) Missed x2 07/31/2012    History OB History   Grav Para Term Preterm Abortions TAB SAB Ect Mult Living   5 2 2  2  2   2     2007--Primary LTCS for FTP/NRFHR, 8+5, 38 6/7 weeks, female, spinal, Dr. Henderson Cloud 2011--SAB at 6 weeks 2012--SAB at 11 weeks, D&E 2013--VBAC, female, 9+13, waterbirth, [facility],  40 3/7 weeks, 1st degree, 8 hour labor  Past Medical History  Diagnosis Date  . Abnormal Pap smear    Past Surgical History  Procedure Laterality Date  . Cesarean section    . Wisdom tooth extraction    . Dilation and curettage of uterus     Family History: family history includes Diabetes in her maternal grandmother; Hypothyroidism in her mother.  Social History:  reports that she has never smoked. She has never used smokeless tobacco. She  reports that she does not drink alcohol or use illicit drugs. Husband is involved and supportive.  Patient is employed.   Prenatal Transfer Tool  Maternal Diabetes: No Genetic Screening: Normal Maternal Ultrasounds/Referrals: Normal Fetal Ultrasounds or other Referrals:  None Maternal Substance Abuse:  No Significant Maternal Medications:  None Significant Maternal Lab Results:  Lab values include: Group B Strep positive Other Comments:  Hx LGA infant with last delivery (9+13, with VBAC)  ROS:  Contractions, bloody show, + FM  Dilation: 8 Effacement (%): 90 Station: 0 Exam by:: Nigel Bridgeman, CNM Blood pressure 123/80, pulse 92, last menstrual period 06/02/2012, unknown if currently breastfeeding. Exam Physical Exam  Chest clear Heart RRR without murmur Abd gravid, NT Pelvic--7 cm, 90%, vtx, -1, IBOW. Ext WNL  FHR Category 1  UCs q 3-5 min  Prenatal labs: ABO, Rh: B/POS/-- (01/08 1143) Antibody: NEG (01/08 1143) Rubella: 18.30 (01/08 1143) RPR: NON REAC (01/08 1143)  HBsAg: NEGATIVE (01/08 1143)  HIV: NON REACTIVE (01/08 1143)  GBS: Positive (01/08 0000) 1st trimester screen and AFP WNL Glucola WNL Hgb 11.9 at glucola visit  Assessment/Plan: IUP at 41 2/7 weeks Active labor GBS positive Previous C/S, with subsequent VBAC Desires waterbirth--previous waterbirth in 2013  Plan: Admitted to [facility] per consult with Dr. Dion Body Routine [facility] orders Ampicillin 2 gm IV as GBS prophylaxis due to advanced labor May do waterbirth--consent signed.   Vontae Court 03/31/2013, 1:00 AM

## 2013-03-31 ENCOUNTER — Encounter (HOSPITAL_COMMUNITY): Payer: Self-pay

## 2013-03-31 DIAGNOSIS — IMO0002 Reserved for concepts with insufficient information to code with codable children: Secondary | ICD-10-CM | POA: Diagnosis not present

## 2013-03-31 DIAGNOSIS — IMO0001 Reserved for inherently not codable concepts without codable children: Secondary | ICD-10-CM

## 2013-03-31 DIAGNOSIS — O34219 Maternal care for unspecified type scar from previous cesarean delivery: Secondary | ICD-10-CM | POA: Diagnosis not present

## 2013-03-31 LAB — CBC
HCT: 35.1 % — ABNORMAL LOW (ref 36.0–46.0)
Hemoglobin: 11.8 g/dL — ABNORMAL LOW (ref 12.0–15.0)
MCH: 27.2 pg (ref 26.0–34.0)
MCHC: 33.6 g/dL (ref 30.0–36.0)
MCV: 80.9 fL (ref 78.0–100.0)
Platelets: 190 10*3/uL (ref 150–400)
RBC: 4.34 MIL/uL (ref 3.87–5.11)
RDW: 13.7 % (ref 11.5–15.5)
WBC: 12.4 10*3/uL — ABNORMAL HIGH (ref 4.0–10.5)

## 2013-03-31 LAB — RPR: RPR Ser Ql: NONREACTIVE

## 2013-03-31 LAB — TYPE AND SCREEN
ABO/RH(D): B POS
Antibody Screen: NEGATIVE

## 2013-03-31 LAB — ABO/RH: ABO/RH(D): B POS

## 2013-03-31 MED ORDER — IBUPROFEN 600 MG PO TABS
600.0000 mg | ORAL_TABLET | Freq: Four times a day (QID) | ORAL | Status: DC
  Administered 2013-03-31 – 2013-04-01 (×5): 600 mg via ORAL
  Filled 2013-03-31 (×5): qty 1

## 2013-03-31 MED ORDER — ONDANSETRON HCL 4 MG PO TABS: 4.0000 mg | ORAL_TABLET | ORAL | Status: DC | PRN

## 2013-03-31 MED ORDER — SIMETHICONE 80 MG PO CHEW: 80.0000 mg | CHEWABLE_TABLET | ORAL | Status: DC | PRN

## 2013-03-31 MED ORDER — ONDANSETRON HCL 4 MG/2ML IJ SOLN: 4.0000 mg | INTRAMUSCULAR | Status: DC | PRN

## 2013-03-31 MED ORDER — TETANUS-DIPHTH-ACELL PERTUSSIS 5-2.5-18.5 LF-MCG/0.5 IM SUSP: 0.5000 mL | Freq: Once | INTRAMUSCULAR | Status: DC

## 2013-03-31 MED ORDER — OXYCODONE-ACETAMINOPHEN 5-325 MG PO TABS: 1.0000 | ORAL_TABLET | ORAL | Status: DC | PRN

## 2013-03-31 MED ORDER — LANOLIN HYDROUS EX OINT: TOPICAL_OINTMENT | CUTANEOUS | Status: DC | PRN

## 2013-03-31 MED ORDER — ZOLPIDEM TARTRATE 5 MG PO TABS: 5.0000 mg | ORAL_TABLET | Freq: Every evening | ORAL | Status: DC | PRN

## 2013-03-31 MED ORDER — OXYTOCIN 10 UNIT/ML IJ SOLN
INTRAMUSCULAR | Status: AC
  Filled 2013-03-31: qty 2

## 2013-03-31 MED ORDER — WITCH HAZEL-GLYCERIN EX PADS: 1.0000 "application " | MEDICATED_PAD | CUTANEOUS | Status: DC | PRN

## 2013-03-31 MED ORDER — PRENATAL MULTIVITAMIN CH
1.0000 | ORAL_TABLET | Freq: Every day | ORAL | Status: DC
  Administered 2013-03-31: 1 via ORAL
  Filled 2013-03-31: qty 1

## 2013-03-31 MED ORDER — SENNOSIDES-DOCUSATE SODIUM 8.6-50 MG PO TABS
2.0000 | ORAL_TABLET | ORAL | Status: DC
  Administered 2013-04-01: 2 via ORAL

## 2013-03-31 MED ORDER — BENZOCAINE-MENTHOL 20-0.5 % EX AERO
1.0000 "application " | INHALATION_SPRAY | CUTANEOUS | Status: DC | PRN
  Filled 2013-03-31: qty 56

## 2013-03-31 MED ORDER — DIPHENHYDRAMINE HCL 25 MG PO CAPS: 25.0000 mg | ORAL_CAPSULE | Freq: Four times a day (QID) | ORAL | Status: DC | PRN

## 2013-03-31 MED ORDER — DIBUCAINE 1 % RE OINT
1.0000 "application " | TOPICAL_OINTMENT | RECTAL | Status: DC | PRN
  Filled 2013-03-31: qty 28

## 2013-03-31 NOTE — Progress Notes (Signed)
Patient to begin pushing at this time; Nigel Bridgeman, CNM at bedside

## 2013-03-31 NOTE — Progress Notes (Signed)
Post Partum Day 0:S/P SVB, bilateral periurethral lacerations Subjective: Patient up ad lib, denies syncope or dizziness. Feeding:  Breast Contraceptive plan:   Undecided  Objective: Blood pressure 111/54, pulse 78, temperature 98.7 F (37.1 C), temperature source Oral, resp. rate 19, height 5\' 10"  (1.778 m), weight 206 lb (93.441 kg), last menstrual period 06/02/2012, SpO2 96.00%, unknown if currently breastfeeding.  Physical Exam:  General: alert Lochia: appropriate Uterine Fundus: firm Incision: healing well DVT Evaluation: No evidence of DVT seen on physical exam. Negative Homan's sign.   Recent Labs  03/31/13  HGB 11.8*  HCT 35.1*    Assessment/Plan: S/P Vaginal delivery day 0 Continue current care Patient has discussed request for d/c tomorrow with pediatrician--peds is OK with d/c 04/01/13, with plan for visit at peds office on 04/02/13. Plan d/c tomorrow.   LOS: 1 day   Nigel Bridgeman 03/31/2013, 9:26 PM

## 2013-03-31 NOTE — Progress Notes (Addendum)
  Subjective: In and out of tub, on toilet, in rocking chair.  Requests VE and AROM, if appropriate.  Objective: BP 123/80  Pulse 92  LMP 06/02/2012     Ampicillin 2 gm dose given at 12:05am.  FHT:  Reassuring UC:   regular, every 4 minutes SVE:   Dilation: 8.5 Effacement (%): 100 Station: 0 Exam by:: Nigel Bridgeman, CNM AROM--clear fluid  Assessment / Plan: Progressive labor Will CTO.  [name] 03/31/2013, 2:03 AM

## 2013-03-31 NOTE — Progress Notes (Signed)
  Subjective: Requests VE for evaluation of progress.  Now in tub, coping well with contractions.  Husband, doula, and friend supportive at her side.  Objective: BP 123/80  Pulse 92  LMP 06/02/2012      FHT: Reassuring UC:   regular, every 3-4 minutes SVE:   Dilation: 8 Effacement (%): 90 Station: 0 Exam by:: Nigel Bridgeman, CNM IBOW  Assessment / Plan: Progressive labor Will CTO.  Nigel Bridgeman 03/31/2013, 12:40am

## 2013-04-01 LAB — CBC
HCT: 32.9 % — ABNORMAL LOW (ref 36.0–46.0)
Hemoglobin: 10.9 g/dL — ABNORMAL LOW (ref 12.0–15.0)
MCH: 27.3 pg (ref 26.0–34.0)
MCHC: 33.1 g/dL (ref 30.0–36.0)
MCV: 82.5 fL (ref 78.0–100.0)
Platelets: 171 10*3/uL (ref 150–400)
RBC: 3.99 MIL/uL (ref 3.87–5.11)
RDW: 13.9 % (ref 11.5–15.5)
WBC: 9.9 10*3/uL (ref 4.0–10.5)

## 2013-04-01 NOTE — Discharge Summary (Signed)
Obstetric Discharge Summary Reason for Admission: onset of labor Prenatal Procedures: ultrasound Intrapartum Procedures: spontaneous vaginal delivery/successful VBAC Postpartum Procedures: none Complications-Operative and Postpartum: none Hemoglobin  Date Value Range Status  04/01/2013 10.9* 12.0 - 15.0 g/dL Final     HCT  Date Value Range Status  04/01/2013 32.9* 36.0 - 46.0 % Final    Physical Exam:  General: alert and no distress Lochia: appropriate Uterine Fundus: firm Incision: n/a DVT Evaluation: No evidence of DVT seen on physical exam.  Discharge Diagnoses: Term Pregnancy-delivered  Discharge Information: Date: 04/01/2013 Activity: pelvic rest Diet: routine Medications: Ibuprofen OTC, declined prescriptions Condition: stable Instructions: refer to practice specific booklet Discharge to: home Follow-up Information   Follow up In 6 weeks. (for post partum visit)       Newborn Data: Live born female  Birth Weight: 9 lb 10.2 oz (4370 g) APGAR: 8, 9  Home with mother.  Tori Cupps Y 04/01/2013, 10:02 AM

## 2014-04-07 ENCOUNTER — Other Ambulatory Visit: Payer: Self-pay | Admitting: Obstetrics and Gynecology

## 2014-04-07 DIAGNOSIS — N6320 Unspecified lump in the left breast, unspecified quadrant: Secondary | ICD-10-CM

## 2014-04-12 ENCOUNTER — Ambulatory Visit
Admission: RE | Admit: 2014-04-12 | Discharge: 2014-04-12 | Disposition: A | Discharge status: Home / Self Care | Payer: Federal, State, Local not specified - PPO | Source: Ambulatory Visit | Attending: Obstetrics and Gynecology | Admitting: Obstetrics and Gynecology

## 2014-04-12 DIAGNOSIS — N6320 Unspecified lump in the left breast, unspecified quadrant: Secondary | ICD-10-CM

## 2014-05-16 IMAGING — US US SOFT TISSUE HEAD/NECK
1 series · 14 of 25 positions shown · non-contrast
Comparison: None.

CLINICAL DATA: Dysphagia, head and neck swelling, feeling of a lump
in throat

THYROID ULTRASOUND
TECHNIQUE: Ultrasound examination of the thyroid gland and adjacent
soft tissues was performed.

[Series 1: us soft tissue head/neck · 0.08mm/px · 14 of 29 slices shown]
[im 1/29]
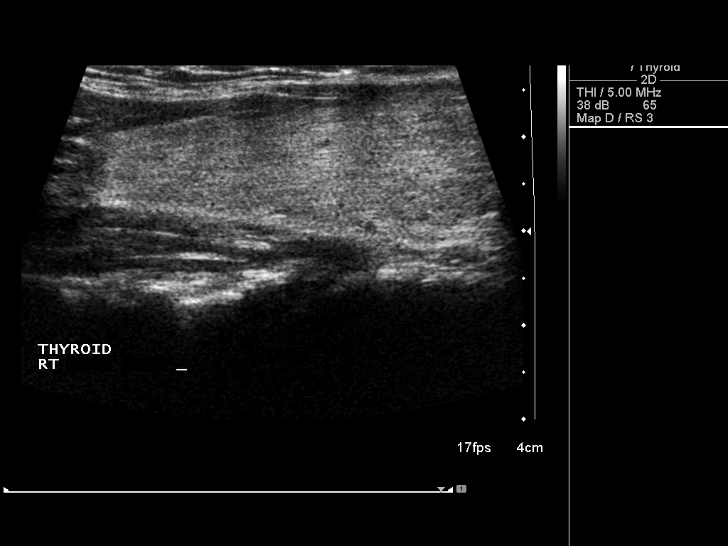
[im 3/29]
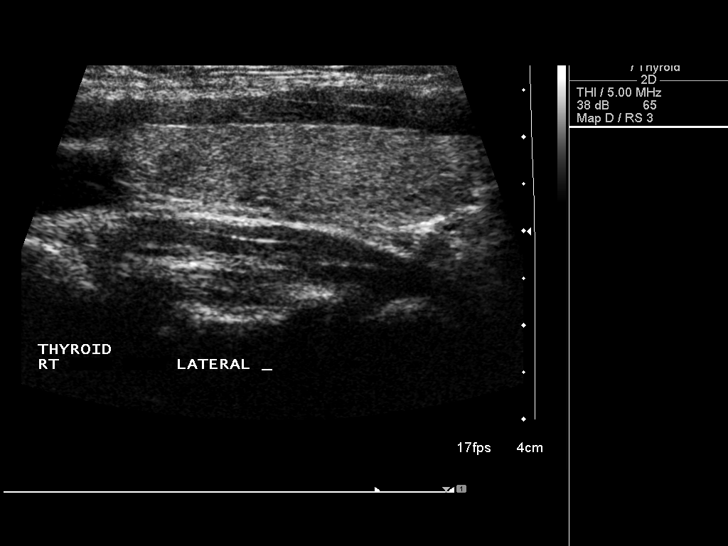
[im 5/29]
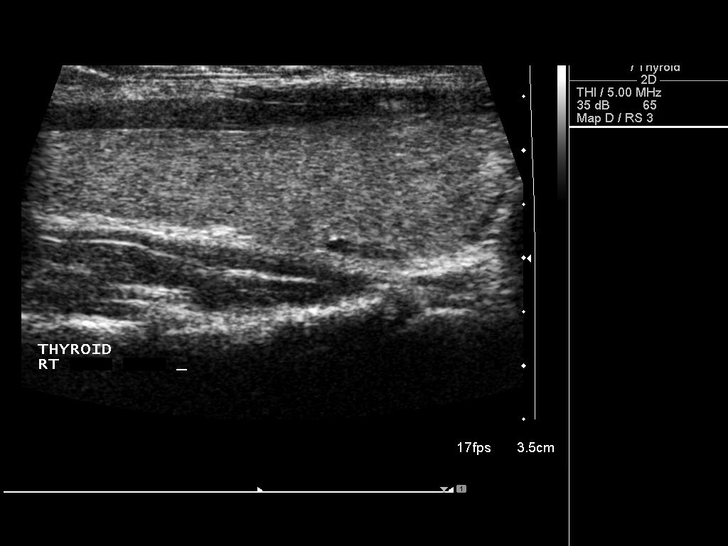
[im 8/29]
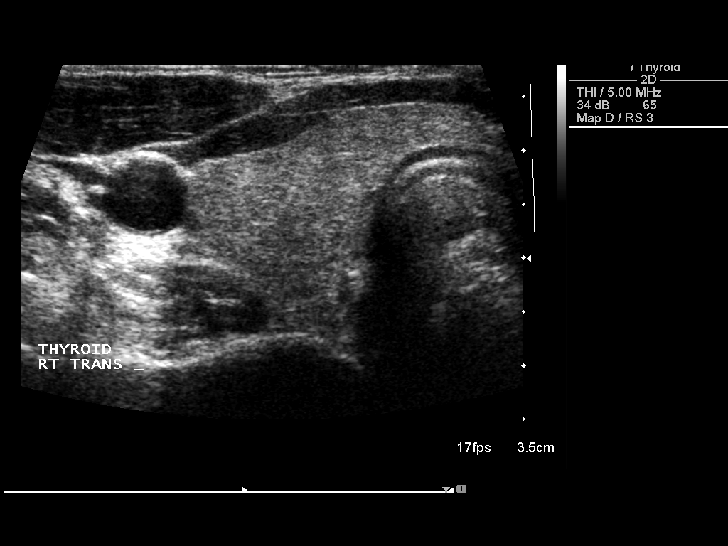
[im 10/29]
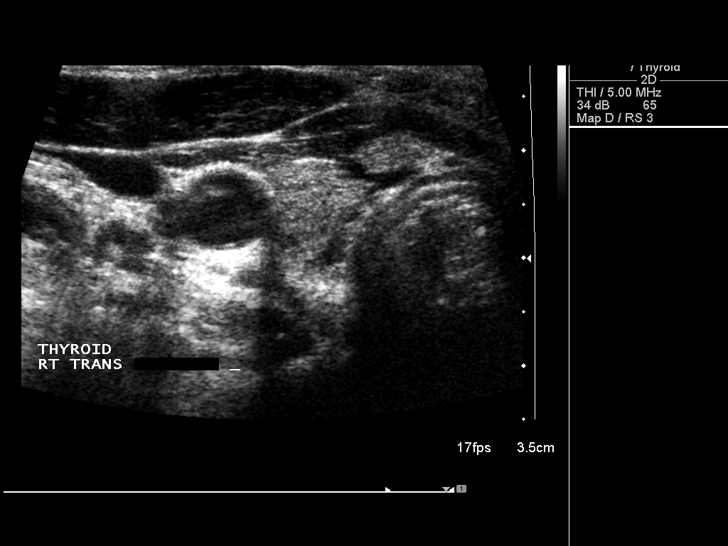
[im 11/29]
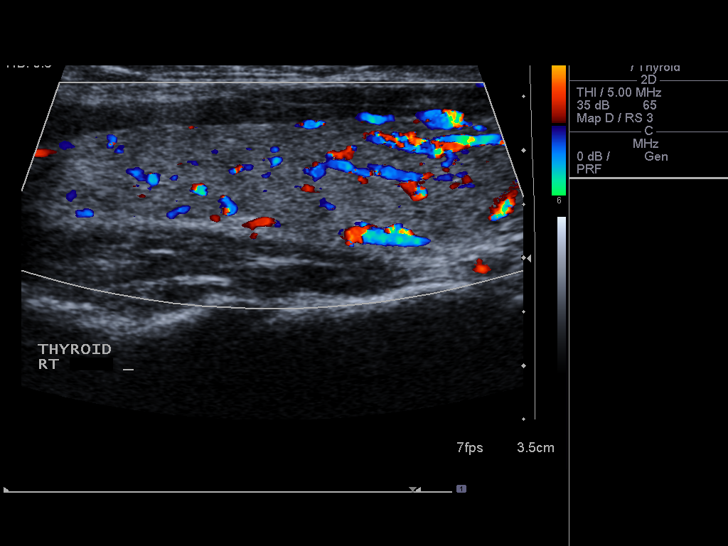
[im 13/29]
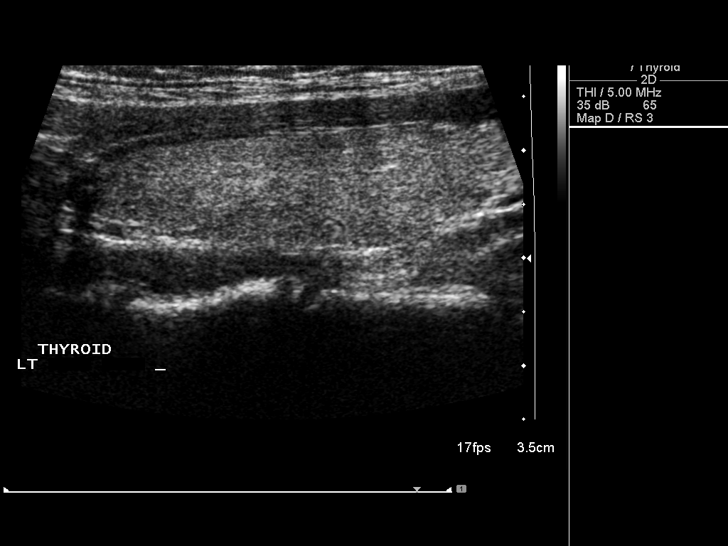
[im 16/29]
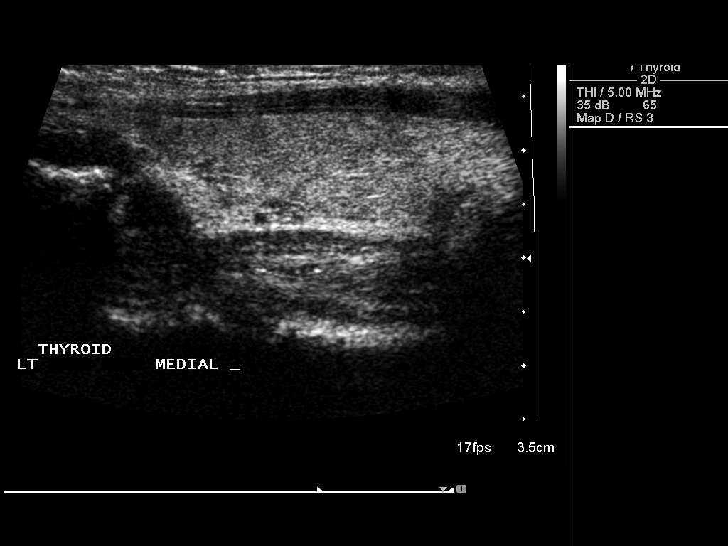
[im 18/29]
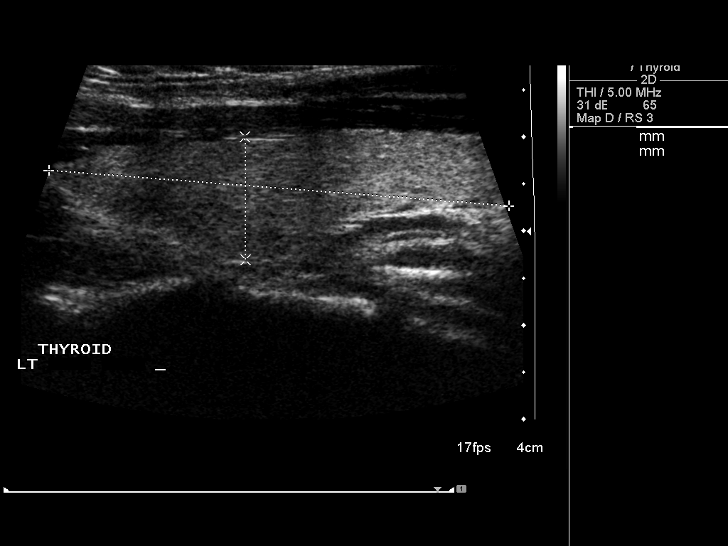
[im 19/29]
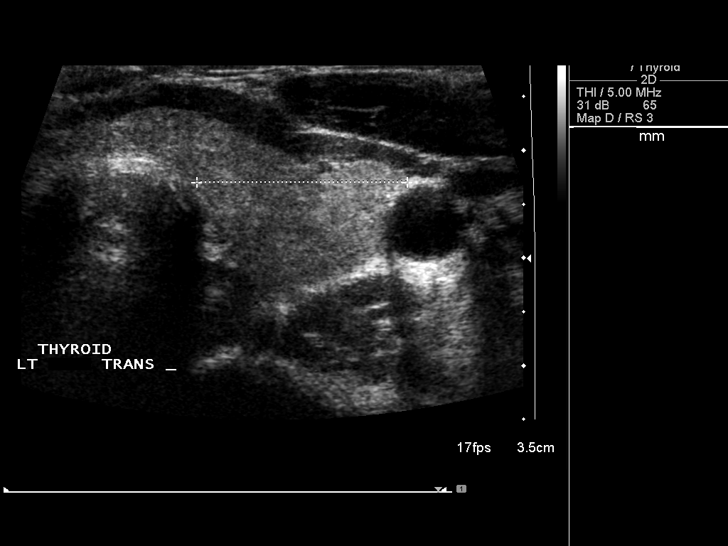
[im 22/29]
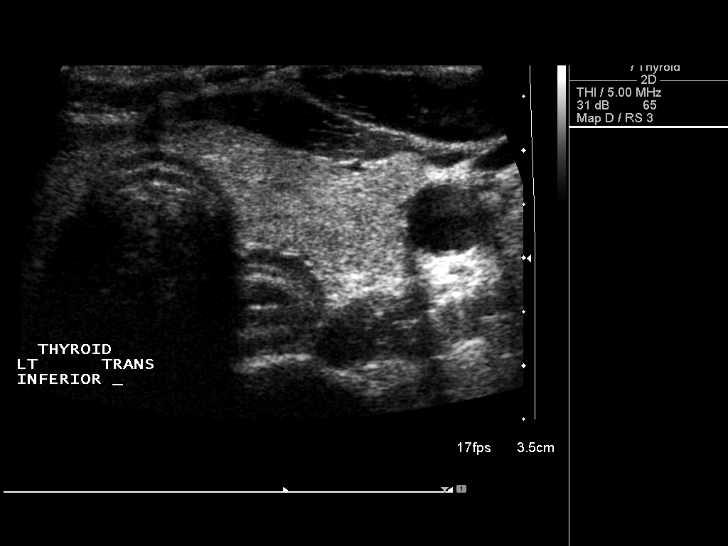
[im 24/29]
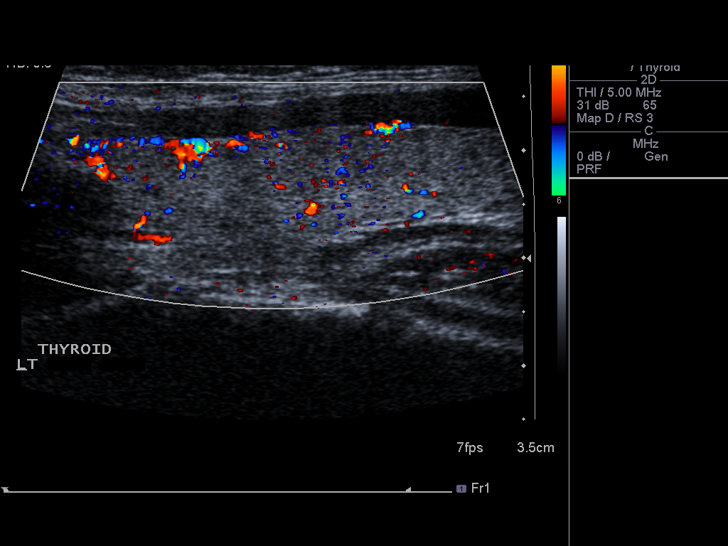
[im 26/29]
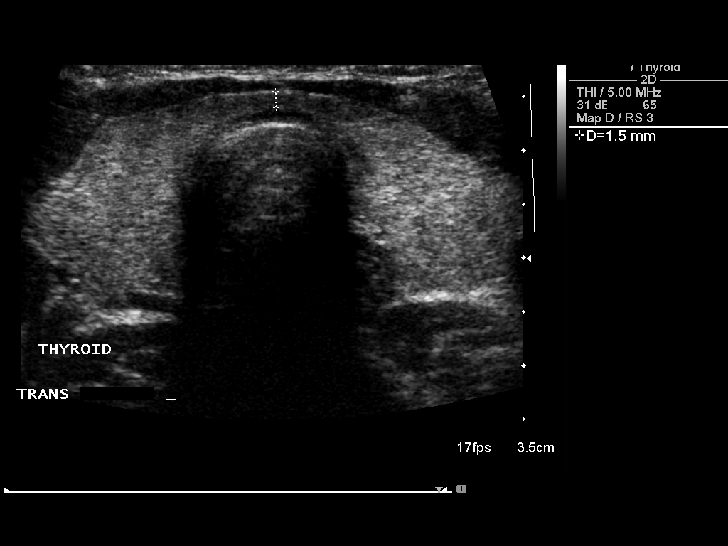
[im 29/29]
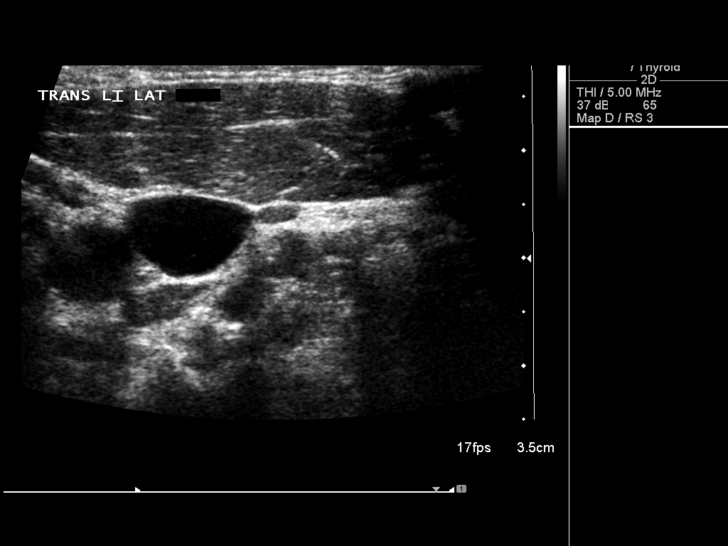

[14 of 25 positions shown; findings below may reference images not displayed]

FINDINGS: Right thyroid lobe:  4.6 x 1.4 x 1.8 cm.
Left thyroid lobe:  4.9 x 1.3 x 2.0 cm.
Isthmus:  2 mm in thickness.

Focal nodules:  The echogenicity of the thyroid gland is
homogeneous.  No solid or cystic nodule is seen.

Lymphadenopathy:  None visualized.
IMPRESSION: The thyroid gland is normal in size.  No solid or cystic nodule is
seen.

## 2014-05-24 ENCOUNTER — Encounter (HOSPITAL_COMMUNITY): Payer: Self-pay

## 2015-02-12 ENCOUNTER — Other Ambulatory Visit (HOSPITAL_COMMUNITY): Payer: Self-pay | Admitting: Emergency Medicine

## 2015-02-12 ENCOUNTER — Ambulatory Visit (HOSPITAL_COMMUNITY)
Admission: RE | Admit: 2015-02-12 | Discharge: 2015-02-12 | Disposition: A | Discharge status: Home / Self Care | Payer: 59 | Source: Ambulatory Visit | Attending: Emergency Medicine | Admitting: Emergency Medicine

## 2015-02-12 ENCOUNTER — Emergency Department (HOSPITAL_COMMUNITY)
Admission: EM | Admit: 2015-02-12 | Discharge: 2015-02-12 | Discharge status: Absent without leave | Payer: Federal, State, Local not specified - PPO

## 2015-02-12 ENCOUNTER — Ambulatory Visit (HOSPITAL_COMMUNITY): Admission: RE | Admit: 2015-02-12 | Payer: 59 | Source: Ambulatory Visit

## 2015-02-12 DIAGNOSIS — M7989 Other specified soft tissue disorders: Secondary | ICD-10-CM | POA: Diagnosis not present

## 2015-02-12 DIAGNOSIS — M79662 Pain in left lower leg: Secondary | ICD-10-CM

## 2015-02-12 DIAGNOSIS — M79605 Pain in left leg: Secondary | ICD-10-CM | POA: Insufficient documentation

## 2015-07-24 NOTE — L&D Delivery Note (Signed)
  Delivery Note Upon arrival to room, is sitting upright and sitting back on her feet.  States she needs to have a bowel movement.  SVE performed, head +3.  Pt desired to deliver the baby upright but eventually leaned forward.  Pt began to push on her own and support the perinuem.  I informed pt that I was assisting support of the perineum and head slowly delivered at direct OA.  Pt continued pushing and shoulders delivered with gentle traction.  Once shoulders were out, hands placed under axilla to aide in delivery of the body.  Baby was placed through mother's legs and passed to mom with the assistance of RN.    At 1:31 PM a viable female, "Darrold Junker", was delivered via Vaginal, Spontaneous Delivery (Presentation: direct OA).  APGAR: 8, 9; weight pending.   Placenta status: Spontaneous, intact.  Cord: 3VC, no nuchal cord, with the following complications: None  Cord pH: None  Cord clamped and cut after cord stopped pulsating which was at least 5+ minutes after delivery.  Pitocin bolus given while waiting to clamp cord.  No sponges were placed vaginally.  Anesthesia:  Epidural  Episiotomy:  None Lacerations: Abrasions near posterior forchette Suture Repair: n/a Est. Blood Loss (mL):  200  Mom to postpartum.  Baby to Couplet care / Skin to Skin. Patient anticipates using vasectomy.

## 2015-08-16 LAB — OB RESULTS CONSOLE GC/CHLAMYDIA
Chlamydia: NEGATIVE
Gonorrhea: NEGATIVE

## 2015-08-16 LAB — OB RESULTS CONSOLE HIV ANTIBODY (ROUTINE TESTING)
HIV: NONREACTIVE
HIV: NONREACTIVE
HIV: NONREACTIVE

## 2015-08-19 LAB — OB RESULTS CONSOLE GBS: GBS: POSITIVE

## 2016-02-23 ENCOUNTER — Other Ambulatory Visit: Payer: Self-pay | Admitting: Obstetrics and Gynecology

## 2016-02-23 ENCOUNTER — Inpatient Hospital Stay (HOSPITAL_COMMUNITY)
Admission: AD | Admit: 2016-02-23 | Discharge: 2016-02-24 | DRG: 775 | Disposition: A | Discharge status: Home / Self Care | Payer: BLUE CROSS/BLUE SHIELD | Source: Ambulatory Visit | Attending: Obstetrics and Gynecology | Admitting: Obstetrics and Gynecology

## 2016-02-23 DIAGNOSIS — O9902 Anemia complicating childbirth: Secondary | ICD-10-CM | POA: Diagnosis present

## 2016-02-23 DIAGNOSIS — O4100X0 Oligohydramnios, unspecified trimester, not applicable or unspecified: Secondary | ICD-10-CM | POA: Diagnosis present

## 2016-02-23 DIAGNOSIS — N96 Recurrent pregnancy loss: Secondary | ICD-10-CM | POA: Diagnosis present

## 2016-02-23 DIAGNOSIS — F419 Anxiety disorder, unspecified: Secondary | ICD-10-CM | POA: Diagnosis present

## 2016-02-23 DIAGNOSIS — Z98891 History of uterine scar from previous surgery: Secondary | ICD-10-CM

## 2016-02-23 DIAGNOSIS — Z8759 Personal history of other complications of pregnancy, childbirth and the puerperium: Secondary | ICD-10-CM

## 2016-02-23 DIAGNOSIS — O34211 Maternal care for low transverse scar from previous cesarean delivery: Secondary | ICD-10-CM | POA: Diagnosis present

## 2016-02-23 DIAGNOSIS — O99344 Other mental disorders complicating childbirth: Secondary | ICD-10-CM | POA: Diagnosis present

## 2016-02-23 DIAGNOSIS — Z3A41 41 weeks gestation of pregnancy: Secondary | ICD-10-CM

## 2016-02-23 DIAGNOSIS — O4103X0 Oligohydramnios, third trimester, not applicable or unspecified: Secondary | ICD-10-CM

## 2016-02-23 DIAGNOSIS — Z3A40 40 weeks gestation of pregnancy: Secondary | ICD-10-CM

## 2016-02-23 DIAGNOSIS — O99820 Streptococcus B carrier state complicating pregnancy: Secondary | ICD-10-CM | POA: Diagnosis present

## 2016-02-23 DIAGNOSIS — D649 Anemia, unspecified: Secondary | ICD-10-CM | POA: Diagnosis present

## 2016-02-23 DIAGNOSIS — Z833 Family history of diabetes mellitus: Secondary | ICD-10-CM | POA: Diagnosis not present

## 2016-02-23 DIAGNOSIS — O480 Post-term pregnancy: Secondary | ICD-10-CM

## 2016-02-23 DIAGNOSIS — O99824 Streptococcus B carrier state complicating childbirth: Secondary | ICD-10-CM | POA: Diagnosis present

## 2016-02-23 DIAGNOSIS — O3663X0 Maternal care for excessive fetal growth, third trimester, not applicable or unspecified: Secondary | ICD-10-CM | POA: Diagnosis present

## 2016-02-23 DIAGNOSIS — O09529 Supervision of elderly multigravida, unspecified trimester: Secondary | ICD-10-CM

## 2016-02-23 DIAGNOSIS — B951 Streptococcus, group B, as the cause of diseases classified elsewhere: Secondary | ICD-10-CM

## 2016-02-23 MED ORDER — LIDOCAINE HCL (PF) 1 % IJ SOLN: 30.0000 mL | INTRAMUSCULAR | Status: DC | PRN

## 2016-02-23 MED ORDER — ONDANSETRON HCL 4 MG/2ML IJ SOLN: 4.0000 mg | Freq: Four times a day (QID) | INTRAMUSCULAR | Status: DC | PRN

## 2016-02-23 MED ORDER — ACETAMINOPHEN 325 MG PO TABS: 650.0000 mg | ORAL_TABLET | ORAL | Status: DC | PRN

## 2016-02-23 MED ORDER — PENICILLIN G POTASSIUM 5000000 UNITS IJ SOLR
2.5000 10*6.[IU] | INTRAVENOUS | Status: DC
  Filled 2016-02-23: qty 2.5

## 2016-02-23 MED ORDER — SOD CITRATE-CITRIC ACID 500-334 MG/5ML PO SOLN: 30.0000 mL | ORAL | Status: DC | PRN

## 2016-02-23 MED ORDER — OXYTOCIN BOLUS FROM INFUSION: 500.0000 mL | Freq: Once | INTRAVENOUS | Status: DC

## 2016-02-23 MED ORDER — OXYTOCIN 40 UNITS IN LACTATED RINGERS INFUSION - SIMPLE MED: 2.5000 [IU]/h | INTRAVENOUS | Status: DC

## 2016-02-23 MED ORDER — OXYCODONE-ACETAMINOPHEN 5-325 MG PO TABS: 2.0000 | ORAL_TABLET | ORAL | Status: DC | PRN

## 2016-02-23 MED ORDER — LACTATED RINGERS IV SOLN: 500.0000 mL | INTRAVENOUS | Status: DC | PRN

## 2016-02-23 MED ORDER — LACTATED RINGERS IV SOLN: INTRAVENOUS | Status: DC

## 2016-02-23 MED ORDER — OXYCODONE-ACETAMINOPHEN 5-325 MG PO TABS: 1.0000 | ORAL_TABLET | ORAL | Status: DC | PRN

## 2016-02-23 MED ORDER — NALBUPHINE HCL 10 MG/ML IJ SOLN: 5.0000 mg | INTRAMUSCULAR | Status: DC | PRN

## 2016-02-23 MED ORDER — FLEET ENEMA 7-19 GM/118ML RE ENEM: 1.0000 | ENEMA | RECTAL | Status: DC | PRN

## 2016-02-23 MED ORDER — HYDROXYZINE HCL 50 MG PO TABS: 50.0000 mg | ORAL_TABLET | Freq: Four times a day (QID) | ORAL | Status: DC | PRN

## 2016-02-23 MED ORDER — PENICILLIN G POTASSIUM 5000000 UNITS IJ SOLR
5.0000 10*6.[IU] | Freq: Once | INTRAVENOUS | Status: DC
  Filled 2016-02-23: qty 5

## 2016-02-24 ENCOUNTER — Encounter (HOSPITAL_COMMUNITY): Payer: Self-pay

## 2016-02-24 ENCOUNTER — Inpatient Hospital Stay (HOSPITAL_COMMUNITY)
Admission: AD | Admit: 2016-02-24 | Discharge: 2016-02-26 | Disposition: A | Discharge status: Home / Self Care | Payer: BLUE CROSS/BLUE SHIELD | Source: Ambulatory Visit | Attending: Obstetrics and Gynecology | Admitting: Obstetrics and Gynecology

## 2016-02-24 DIAGNOSIS — O34211 Maternal care for low transverse scar from previous cesarean delivery: Secondary | ICD-10-CM | POA: Diagnosis present

## 2016-02-24 DIAGNOSIS — O3663X0 Maternal care for excessive fetal growth, third trimester, not applicable or unspecified: Secondary | ICD-10-CM | POA: Diagnosis present

## 2016-02-24 DIAGNOSIS — O4103X0 Oligohydramnios, third trimester, not applicable or unspecified: Principal | ICD-10-CM | POA: Diagnosis present

## 2016-02-24 DIAGNOSIS — O480 Post-term pregnancy: Secondary | ICD-10-CM | POA: Diagnosis present

## 2016-02-24 DIAGNOSIS — O09529 Supervision of elderly multigravida, unspecified trimester: Secondary | ICD-10-CM

## 2016-02-24 DIAGNOSIS — IMO0002 Reserved for concepts with insufficient information to code with codable children: Secondary | ICD-10-CM | POA: Diagnosis present

## 2016-02-24 DIAGNOSIS — Z3A41 41 weeks gestation of pregnancy: Secondary | ICD-10-CM

## 2016-02-24 DIAGNOSIS — D649 Anemia, unspecified: Secondary | ICD-10-CM | POA: Diagnosis present

## 2016-02-24 DIAGNOSIS — Z833 Family history of diabetes mellitus: Secondary | ICD-10-CM

## 2016-02-24 DIAGNOSIS — O34219 Maternal care for unspecified type scar from previous cesarean delivery: Secondary | ICD-10-CM | POA: Diagnosis present

## 2016-02-24 DIAGNOSIS — O9902 Anemia complicating childbirth: Secondary | ICD-10-CM | POA: Diagnosis present

## 2016-02-24 DIAGNOSIS — O99344 Other mental disorders complicating childbirth: Secondary | ICD-10-CM | POA: Diagnosis present

## 2016-02-24 DIAGNOSIS — B951 Streptococcus, group B, as the cause of diseases classified elsewhere: Secondary | ICD-10-CM | POA: Diagnosis present

## 2016-02-24 DIAGNOSIS — O4100X0 Oligohydramnios, unspecified trimester, not applicable or unspecified: Secondary | ICD-10-CM | POA: Diagnosis present

## 2016-02-24 DIAGNOSIS — F419 Anxiety disorder, unspecified: Secondary | ICD-10-CM | POA: Diagnosis present

## 2016-02-24 DIAGNOSIS — Z8759 Personal history of other complications of pregnancy, childbirth and the puerperium: Secondary | ICD-10-CM

## 2016-02-24 DIAGNOSIS — O99824 Streptococcus B carrier state complicating childbirth: Secondary | ICD-10-CM | POA: Diagnosis present

## 2016-02-24 HISTORY — DX: Other specified health status: Z78.9

## 2016-02-24 LAB — TYPE AND SCREEN
ABO/RH(D): B POS
Antibody Screen: NEGATIVE

## 2016-02-24 LAB — CBC
HCT: 33.6 % — ABNORMAL LOW (ref 36.0–46.0)
Hemoglobin: 11.5 g/dL — ABNORMAL LOW (ref 12.0–15.0)
MCH: 28.9 pg (ref 26.0–34.0)
MCHC: 34.2 g/dL (ref 30.0–36.0)
MCV: 84.4 fL (ref 78.0–100.0)
Platelets: 198 10*3/uL (ref 150–400)
RBC: 3.98 MIL/uL (ref 3.87–5.11)
RDW: 13.2 % (ref 11.5–15.5)
WBC: 10.6 10*3/uL — ABNORMAL HIGH (ref 4.0–10.5)

## 2016-02-24 LAB — RAPID HIV SCREEN (HIV 1/2 AB+AG)
HIV 1/2 Antibodies: NONREACTIVE
HIV-1 P24 Antigen - HIV24: NONREACTIVE

## 2016-02-24 MED ORDER — ACETAMINOPHEN 325 MG PO TABS: 650.0000 mg | ORAL_TABLET | ORAL | Status: DC | PRN

## 2016-02-24 MED ORDER — LACTATED RINGERS IV SOLN
500.0000 mL | Freq: Once | INTRAVENOUS | Status: DC
Start: 2016-02-24 — End: 2016-02-24

## 2016-02-24 MED ORDER — PHENYLEPHRINE 40 MCG/ML (10ML) SYRINGE FOR IV PUSH (FOR BLOOD PRESSURE SUPPORT): 80.0000 ug | PREFILLED_SYRINGE | INTRAVENOUS | Status: DC | PRN

## 2016-02-24 MED ORDER — SODIUM CHLORIDE 0.9% FLUSH: 3.0000 mL | INTRAVENOUS | Status: DC | PRN

## 2016-02-24 MED ORDER — ONDANSETRON HCL 4 MG/2ML IJ SOLN: 4.0000 mg | Freq: Four times a day (QID) | INTRAMUSCULAR | Status: DC | PRN

## 2016-02-24 MED ORDER — SODIUM CHLORIDE 0.9% FLUSH: 3.0000 mL | Freq: Two times a day (BID) | INTRAVENOUS | Status: DC

## 2016-02-24 MED ORDER — PENICILLIN G POTASSIUM 5000000 UNITS IJ SOLR
5.0000 10*6.[IU] | Freq: Once | INTRAMUSCULAR | Status: AC
  Administered 2016-02-24: 5 10*6.[IU] via INTRAVENOUS
  Filled 2016-02-24: qty 5

## 2016-02-24 MED ORDER — SODIUM CHLORIDE 0.9 % IV SOLN: 250.0000 mL | INTRAVENOUS | Status: DC | PRN

## 2016-02-24 MED ORDER — PENICILLIN G POTASSIUM 5000000 UNITS IJ SOLR
2.5000 10*6.[IU] | INTRAMUSCULAR | Status: DC
  Administered 2016-02-25 (×3): 2.5 10*6.[IU] via INTRAVENOUS
  Filled 2016-02-24 (×7): qty 2.5

## 2016-02-24 MED ORDER — EPHEDRINE 5 MG/ML INJ: 10.0000 mg | INTRAVENOUS | Status: DC | PRN

## 2016-02-24 MED ORDER — OXYTOCIN 40 UNITS IN LACTATED RINGERS INFUSION - SIMPLE MED: 2.5000 [IU]/h | INTRAVENOUS | Status: DC

## 2016-02-24 MED ORDER — LIDOCAINE HCL (PF) 1 % IJ SOLN
30.0000 mL | INTRAMUSCULAR | Status: DC | PRN
  Filled 2016-02-24: qty 30

## 2016-02-24 MED ORDER — TERBUTALINE SULFATE 1 MG/ML IJ SOLN
0.2500 mg | Freq: Once | INTRAMUSCULAR | Status: DC | PRN
  Filled 2016-02-24: qty 1

## 2016-02-24 MED ORDER — OXYCODONE-ACETAMINOPHEN 5-325 MG PO TABS: 2.0000 | ORAL_TABLET | ORAL | Status: DC | PRN

## 2016-02-24 MED ORDER — OXYTOCIN BOLUS FROM INFUSION: 500.0000 mL | Freq: Once | INTRAVENOUS | Status: DC

## 2016-02-24 MED ORDER — FENTANYL CITRATE (PF) 100 MCG/2ML IJ SOLN: 100.0000 ug | INTRAMUSCULAR | Status: DC | PRN

## 2016-02-24 MED ORDER — FENTANYL 2.5 MCG/ML BUPIVACAINE 1/10 % EPIDURAL INFUSION (WH - ANES): 14.0000 mL/h | INTRAMUSCULAR | Status: DC | PRN

## 2016-02-24 MED ORDER — OXYCODONE-ACETAMINOPHEN 5-325 MG PO TABS: 1.0000 | ORAL_TABLET | ORAL | Status: DC | PRN

## 2016-02-24 MED ORDER — ZOLPIDEM TARTRATE 5 MG PO TABS: 5.0000 mg | ORAL_TABLET | Freq: Every evening | ORAL | Status: DC | PRN

## 2016-02-24 MED ORDER — SOD CITRATE-CITRIC ACID 500-334 MG/5ML PO SOLN: 30.0000 mL | ORAL | Status: DC | PRN

## 2016-02-24 MED ORDER — FLEET ENEMA 7-19 GM/118ML RE ENEM: 1.0000 | ENEMA | RECTAL | Status: DC | PRN

## 2016-02-24 MED ORDER — LACTATED RINGERS IV SOLN: 500.0000 mL | INTRAVENOUS | Status: DC | PRN

## 2016-02-24 MED ORDER — LACTATED RINGERS IV SOLN
INTRAVENOUS | Status: DC
  Administered 2016-02-24 – 2016-02-25 (×2): via INTRAVENOUS

## 2016-02-24 MED ORDER — DIPHENHYDRAMINE HCL 50 MG/ML IJ SOLN: 12.5000 mg | INTRAMUSCULAR | Status: DC | PRN

## 2016-02-24 NOTE — Progress Notes (Signed)
  Subjective: Comfortable--completed initial PCN dose.  Objective: BP (!) 122/58   Pulse 76   Temp 97.7 F (36.5 C) (Oral)   Ht 5\' 10"  (1.778 m)   Wt 88.9 kg (196 lb)   LMP 05/13/2015   BMI 28.12 kg/m  No intake/output data recorded. No intake/output data recorded.  FHT: Category 1 UC:   Occasional SVE:   Dilation: 4 Effacement (%): 70 Station: -1, -2 Exam by:: Nigel Bridgeman CNM AROM--small amount clear fluid noted.  Assessment:  Induction for oligo Hopes to avoid pitocin  Plan: Observe labor progress s/p AROM. May consider augmentation with Cytotech if needed.  Nigel Bridgeman CNM 02/24/2016, 11:36 PM

## 2016-02-24 NOTE — Discharge Instructions (Signed)
Oligohydramnios °An unborn baby (fetus) lives in the mother's uterus in a sac of amniotic fluid. This fluid: °· Protects the fetus from trauma. °· Helps the fetus move freely inside the uterus. °· Helps the fetal lungs, kidneys, and digestive system develop. °· Protects the baby from infections.   °Oligohydramnios is when there is not enough amniotic fluid in the amniotic sac. If this happens early in pregnancy, a fetus might not develop normally. If this happens in the second half of a pregnancy, a fetus might not grow as much as it should and could cause problems during delivery.   °Oligohydramnios can also cause: °· Pregnancy loss (miscarriage). °· Premature birth. °· Deformities of the face or body. °· Problems with muscles and bones. °· Pressure and compression on the umbilical cord, which decreases oxygen to the fetus. °· Lung problems. °· Stillbirth. °CAUSES °A cause cannot always be found. However, possible causes include: °· A leak or a tear in the amniotic sac. °· A problem with the placenta. °· Having identical twins who share the same placenta. °· A fetal birth defect. This is usually something in the fetal kidneys or urinary tract that has not developed as it should. °· A pregnancy that goes past the due date. °· Something that affects the mother's body, such as: °¨ Dehydration. °¨ High blood pressure. °¨ Diabetes. °¨ Some medications (examples include ibuprofen and blood pressure medicines).  °¨ A disease that affects the skin, joints, kidneys, and other organs (systemic lupus). °¨ Birth defects. °SYMPTOMS °· There may be no symptoms.  °· Leaking fluid from the vagina. °· Measuring smaller than usual uterus at a routine pregnancy exam. °DIAGNOSIS °To diagnose and evaluate oligohydramnios, your caregiver may: °· Order a prenatal ultrasound test. This test: °¨ Measures the amniotic fluid level. This will show if the amount of fluid is right for the stage of pregnancy. °¨ Checks the fetal  kidneys. °¨ Checks fetal growth. °¨ Evaluates the placenta. °· Confirm that you broke your water, if this is suspected by your caregiver. °· Order a nonstress test. This noninvasive test is an assessment of fetal well-being. It monitors the fetal heart rate patterns over a 20-minute period. °· Order a biophysical profile. This test measures and evaluates 5 observations of the baby (results of nonstress testing, fetal breathing, movements, muscle tone, and amount of amniotic fluid). °· Assess fetal kick counts. Tell your caregiver if the baby appears to become less active. °· Order a uterine artery Doppler study. This is a type of ultrasound. It can show if enough blood and nourishment are getting to the fetus through the placenta and umbilical cord. °· Check your blood pressure. °· Check your blood sugar. °TREATMENT °Treatment will depend on how low the fluid is, how far along in the pregnancy you are, and your overall health. Treatment options include: °· Watching and waiting. You will need to be checked more often than normal. °· Increasing your fluid intake. This may be done by mouth, or you might get the fluids through the vein (intravenously, IV). °· Delivering the baby if recommended by your caregiver. °HOME CARE INSTRUCTIONS °· Only take medicine as directed by your caregiver, especially if you have a medical problem (diabetes, high blood pressure). °· Follow your caregiver's instructions regarding physical activity, especially if you have a medical problem (diabetes, high blood pressure). °· Keep all prenatal care appointments. °· Rest as much as possible. Your caregiver may put you on bed rest. °· Drink enough fluids to keep your urine clear or pale yellow. °·   Eat a healthy and nourishing diet. °· Do not smoke, drink alcohol, or take illegal drugs.  °SEEK MEDICAL CARE IF: °· You have any questions or worries about your pregnancy. °· You notice a decrease in fetal movement. °SEEK IMMEDIATE MEDICAL CARE IF:   °· Fluid comes out of your vagina. °· You start to have labor pains (contractions). °· You have a fever. °  °This information is not intended to replace advice given to you by your health care provider. Make sure you discuss any questions you have with your health care provider. °  °Document Released: 10/24/2010 Document Revised: 07/30/2014 Document Reviewed: 10/24/2010 °Elsevier Interactive Patient Education ©2016 [employer] ° °

## 2016-02-24 NOTE — H&P (Signed)
[name] is a 35 y.o. female, [phone] @ 85 wksestimated gestational age (as dated by LMP (05/13/15)c/w 8.6week ultrasound, EDD 02/17/16), presented to hospital now in agreement with recommendation for induction due to oligohydramnios noted on [place] 02/23/16--AFI 3 cm.  Other u/s findings = BPP 6/8 & EFW 9+10 -- 4395 g; 97th%tile.) Endorses FM and mild ctxs. Denies VB or LOF.  Induction was recommended yesterday s/p dx of oligo on office US--patient declined immediate induction, discussed options for care with me by phone yesterday.  Presented later last night, with following note by [name], CNM:  "Oligo at 40.6 wks w/ h/o c-section w/ successful VBAC x 2. Couple's plan was to see if labor progressed during the night with AROM in the am as induction/augmentation method if not in labor by then. Their desire is to avoid Pitocin if at all possible as pt prefers not to have an epidural. Discussed w/ Dr. Normand Sloop. Couple was given the option to 1) stay for IOL w/ Pitocin, or 2) stay for antenatal testing w/ return for induction in the a.m. Couple elected the latter. Discussed best time w/ charge nurse -- couple instructed to arrive between 7:30-8:00 AM. Couple verbalized understanding of the risks associated w/ their decision to leave after NST, to include fetal death, and stated they were willing to assume such risks. These same risks were reviewed should they decide not to arrive for the scheduled induction in the am."  [facility] 13.4 [facility].  Prenatal course c/b: Oligo (AFI 3 cm on 02/23/16) H/O c-section w/ successful VBAC x 2 via Waterbirth delivery H/O LGA infants (see OB history below) H/O rapid labor/delivery GBS positive Recurrent miscarriage AMA Late term pregnancy           OB History    Gravida Para Term Preterm AB Living   SAB TAB Ectopic Multiple Live Births   2       2    C-section in 2007 @ 38.6 wks, female infant, birthwt 8 lbs 5  ozs SAB at 6 wks in 2011 SAB at 11 wks in 2012 VBAC on 08/27/11 @ 40.3 wks; female infant, birthwt 9+13 [name]; [facility] VBAC on 03/31/13 @ 41.2 wks; female infant, birthwt 9+10 [name]     Past Medical History:  Diagnosis Date  . Abnormal Pap smear         Past Surgical History:  Procedure Laterality Date  . CESAREAN SECTION    . DILATION AND CURETTAGE OF UTERUS    . WISDOM TOOTH EXTRACTION     Family History: family history includes Diabetes in her maternal grandmother; Hypothyroidism in her mother.  Social History:  reports that she has never smoked. She has never used smokeless tobacco. She reports that she does not drink alcohol or use drugs.  She is Caucasian, college-educated, married to [name], who is involved and supportive.     Maternal Diabetes: No Genetic Screening: Declined Maternal Ultrasounds/Referrals: Anatomy: Singleton pregnancy. Vertex presentation. Cervix measured transabdominally 4.4cm. Anterior placenta measures 6.3cm from internal OS. Amniotic fluid appears to be normal. Additional anatomy SEEN: CP/Thal/Orbits/Palate/NB/Profile/Diaphragm/Renal arteries. 4CH/RVOT/LVOT/3VV NOT SEEN due to fetal position. Ovaries/adnexas WNL. GROWTH 44%.   2) U/S @ 28.4 wks -- f/u anatomy: SIUP, BREECH, NORMAL FLUID, GROWTH 61%, CX 5.96 CM - ANATOMY COMPLETE.  Fetal Ultrasounds or other Referrals:  None Maternal Substance Abuse:  No Significant Maternal Medications:  Meds include: Other: PNV, Nature-Throid, Magnesium Significant  Maternal Lab Results:  Lab values include: Group B Strep positive Other Comments:  Declined flu. Pt was interested in Tdap, but shortage in office, therefore did not receive.  ROS 10 Systems reviewed and are negative for acute change except as noted in the HPI.   History   Contractions: Onset was 1 week or more ago.  Frequency: irregular.  Duration is approximately 60 seconds.  Perceived severity is mild.   Fetal  activity: Perceived fetal activity is normal.  Last perceived fetal movement was within the past hour.   Prenatal complications: No bleeding.  Prenatal Complications - Diabetes: none.    There were no vitals filed for this visit.  Exam  Dilation: 4 cm, posterior Effacement (%): 70 Station: -1/2, well-applied  Bishop score: 8  Maternal Exam:  Uterine Assessment: Contraction strength is mild. Contraction duration is 60 seconds. Contraction frequency is irregular.   Abdomen: Patient reports no abdominal tenderness. Fundal height is S>D.  Estimated fetal weight is 9lbs 10 oz per scan on 02/23/16.  Fetal presentation: vertex  Introitus: Normal vulva. Normal vagina. Pelvis: adequate for delivery.  Cervix: Cervix evaluated by digital exam.   Fetal Exam Category 1  Physical Exam  Nursing noteand vitalsreviewed. Constitutional: She is oriented to person, place, and time. She appears well-developedand well-nourished. No distress.  HENT:  Head: Normocephalicand atraumatic.  Neck: Normal range of motion. Neck supple.  Cardiovascular: Normal rate, regular rhythmand normal heart sounds. Exam reveals no gallopand no friction rub.  No murmurheard. Respiratory: Effort normaland breath sounds normal.  GI: Soft. She exhibits no distensionand no mass. There is no tenderness. There is no reboundand no guarding.  Genitourinary: Vagina normaland uterus normal.  Musculoskeletal: Normal range of motion. She does not exhibit edema.  Neurological: She is alertand oriented to person, place, and time. She has normal reflexes.  Skin: Skin is warmand dry.  Psychiatric: She has a normal mood and affect.    Prenatal labs: ABO, Rh: B+ Antibody: Neg Rubella: Immune RPR: NR HBsAg: Neg HIV: Neg  GBS: Positive Normal glucola testing Hgb 13 at NOB, 11.9 at 28 weeks Pap WNL 2015  Assessment: IUP at 41 weeks Oligohydramnios LGA fetus--hx LGA x 2, largest baby  9+13 GBS positive Hx rapid delivery Post dates Hx previous C/S due to FTP, with successful VBAC x 2 (previous waterbirths)   Plan: Admit to [facility] per consult with Dr. Stefano Gaul Routine [facility] orders GBS prophylaxis with PCN Risks and benefits of induction were reviewed, including failure of method, prolonged labor, need for further intervention, risk of cesarean.  Patient and family seem to understand these risks and wish to proceed.  Patient hoping to avoid pitocin--"had bad experience with 1st labor due to pitocin".   Advised patient I would evaluate for option of AROM, with potential for oral Cytotech use if stimulation needed after AROM, or if AROM can't be performed safely. OK for ambulation with wireless monitoring. Long discussion with patient regarding her anxieties and fears about induction--support to her for her goals and preferences, with induction as medically indicated.  Nigel Bridgeman, CNM 02/24/16 8:19p  Addendum: Fetal station appropriate for AROM--will give 1st dose of ATB, then plan AROM.  Nigel Bridgeman, CNM 02/24/16 8:35p

## 2016-02-24 NOTE — Discharge Summary (Signed)
Physician Discharge Summary  Patient ID: [name], [name].  MRN: [id number] DOB/AGE: 1981-06-02/35 yo  Admit date: 02/23/16 Discharge date: 02/23/16  Admission Diagnoses:  IUP at 40.6 wks Oligo   Discharge Diagnoses:  Active Problems: Oligohydramnios w/o rupture of membranes H/O c-section w/ successful VBAC x 2 via Waterbirth delivery H/O LGA infants  H/O rapid labor/delivery GBS positive Recurrent miscarriage AMA Late term pregnancy   Discharged Condition: Stable  Hospital Course: [name] is a 35 y.o. female, [id number] at 40.6 weeks, presented for direct admit secondary to oligohydramnios. Patient was seen in the office today for antenatal testing, and AFI was noted to be 3 cm. Plan made for IOL.Pt w/ h/o c-section w/ successful VBAC x 2. Pt ok with plan as long as she could wait until the am for intervention, i.e. Amniotomy, as she hoped to progress on her own during the night. She also hopes to avoid Pitocin if at all possible as she prefers a non medicated birth. Pt given the option by Dr. Normand Sloop to 1) stay for IOL w/ Pitocin, or 2) stay for antenatal testing w/ return for induction in the a.m. Pt elected the latter and was given an IOL time between 7:30 and 8 AM. She verbalized understanding of the risks associated w/ her decision to leave after NST, to include fetal death, and stated she was willing to assume such risks. These same risks were reviewed should she decide not to arrive for the scheduled induction in the am. Dr. Normand Sloop was notified of the couple's decision and the patient was d/c'd home after Cat 1 FHRT obtained. Labor precautions and strict FKCs were reviewed with the patient, and increase in po fluids were advised.  MEDS: PNV Magnesium Nature-throid   [name], CNM 02/23/16, 10:30 PM

## 2016-02-24 NOTE — Progress Notes (Signed)
  Subjective: Comfortable--initial dose of pitocin infusing at present.  Objective: Temp 97.7 F (36.5 C) (Oral)   Ht 5\' 10"  (1.778 m)   Wt 88.9 kg (196 lb)   LMP 05/13/2015   BMI 28.12 kg/m  No intake/output data recorded. No intake/output data recorded.  FHT: Category 1 UC:   Irregular, mild SVE:   Dilation: 4 Effacement (%): 70 Station: -1, -2 Exam by:: Nigel Bridgeman CNM at 8:35p   Assessment:  Induction for oligo Post-dates GBS positive  Plan: Await completion of initial dose of PCN, then AROM.  Nigel Bridgeman CNM 02/24/2016, 9:50 PM

## 2016-02-24 NOTE — H&P (Signed)
[name] is a 35 y.o. female, [id number] @ 40.6 wks estimated gestational age (as dated by LMP (05/13/15) c/w 8.6 week ultrasound, EDD 02/17/16), presenting for IOL due to oligo on today's scan (AFI 3 cm). Other u/s findings = BPP 6/8 & EFW 9+10 -- 4395 g; 97th%tile.) Endorses FM and mild ctxs. Denies VB or LOF.  Pt entered care at [facility] at 13.4 wks.  Prenatal course c/b: Oligo (AFI 3 cm on 02/23/16) H/O c-section w/ successful VBAC x 2 via Waterbirth delivery H/O LGA infants (see OB history below) H/O rapid labor/delivery GBS positive Recurrent miscarriage AMA Late term pregnancy   OB History    Gravida Para Term Preterm AB Living   SAB TAB Ectopic Multiple Live Births   2       2    C-section in 2007 @ 38.6 wks, female infant, birthwt 8 lbs 5 ozs SAB at 6 wks in 2011 SAB at 11 wks in 2012 VBAC on 08/27/11 @ 40.3 wks; female infant, birthwt 9+13 [name]; [facility] VBAC on 03/31/13 @ 41.2 wks; female infant, birthwt 9+10 [name] Past Medical History:  Diagnosis Date  . Abnormal Pap smear    Past Surgical History:  Procedure Laterality Date  . CESAREAN SECTION    . DILATION AND CURETTAGE OF UTERUS    . WISDOM TOOTH EXTRACTION     Family History: family history includes Diabetes in her maternal grandmother; Hypothyroidism in her mother. Social History:  reports that she has never smoked. She has never used smokeless tobacco. She reports that she does not drink alcohol or use drugs.     Maternal Diabetes: No Genetic Screening: Declined Maternal Ultrasounds/Referrals: Anatomy: Singleton pregnancy. Vertex presentation. Cervix measured transabdominally 4.4cm. Anterior placenta measures 6.3cm from internal OS. Amniotic fluid appears to be normal. Additional anatomy SEEN: CP/Thal/Orbits/Palate/NB/Profile/Diaphragm/Renal arteries. 4CH/RVOT/LVOT/3VV NOT SEEN due to fetal position. Ovaries/adnexas WNL. GROWTH 44%.   2) U/S @ 28.4 wks -- f/u anatomy: SIUP, BREECH,  NORMAL FLUID, GROWTH 61%, CX 5.96 CM - ANATOMY COMPLETE.  Fetal Ultrasounds or other Referrals:  None Maternal Substance Abuse:  No Significant Maternal Medications:  Meds include: Other: PNV, Nature-Throid, Magnesium Significant Maternal Lab Results:  Lab values include: Group B Strep positive Other Comments:  Declined flu. Pt was interested in Tdap, but shortage in office, therefore did not receive.  ROS 10 Systems reviewed and are negative for acute change except as noted in the HPI.   History   Contractions: Onset was 1 week or more ago.   Frequency: irregular.   Duration is approximately 60 seconds.   Perceived severity is mild.    Fetal activity: Perceived fetal activity is normal.   Last perceived fetal movement was within the past hour.    Prenatal complications: No bleeding.   Prenatal Complications - Diabetes: none.    Blood pressure 113/71, pulse 74, temperature 97.7 F (36.5 C), temperature source Oral, resp. rate 18, currently breastfeeding.   Exam  Dilation: 3 Effacement (%): 50 Station: -2 Exam by K. Mayford Knife, CNM  Bishop score: 8  Maternal Exam:  Uterine Assessment: Contraction strength is mild.  Contraction duration is 60 seconds. Contraction frequency is irregular.   Abdomen: Patient reports no abdominal tenderness. Fundal height is S>D.   Estimated fetal weight is 9lbs 10 oz per scan on 02/23/16.   Fetal presentation: vertex  Introitus: Normal vulva. Normal vagina.  Pelvis: adequate for delivery.   Cervix: Cervix evaluated by  digital exam.     Fetal Exam Fetal Monitor Review: Mode: fetoscope.   Baseline rate: 125.  Variability: moderate (6-25 bpm).   Pattern: accelerations present and no decelerations.    Fetal State Assessment: Category I - tracings are normal. Physical Exam   Nursing note and vitals reviewed. Constitutional: She is oriented to person, place, and time. She appears well-developed and well-nourished. No distress.  HENT:   Head: Normocephalic and atraumatic.  Neck: Normal range of motion. Neck supple.  Cardiovascular: Normal rate, regular rhythm and normal heart sounds.  Exam reveals no gallop and no friction rub.   No murmur heard. Respiratory: Effort normal and breath sounds normal.  GI: Soft. She exhibits no distension and no mass. There is no tenderness. There is no rebound and no guarding.  Genitourinary: Vagina normal and uterus normal.  Musculoskeletal: Normal range of motion. She does not exhibit edema.  Neurological: She is alert and oriented to person, place, and time. She has normal reflexes.  Skin: Skin is warm and dry.  Psychiatric: She has a normal mood and affect.    Prenatal labs: ABO, Rh: B+ Antibody: Neg Rubella: Immune RPR: NR HBsAg: Neg HIV: Neg  GBS: Positive  Assessment/Plan: Oligo at 40.6 wks w/ h/o c-section w/ successful VBAC x 2. Couple's plan was to see if labor progressed during the night with AROM in the am as induction/augmentation method if not in labor by then. Their desire is to avoid Pitocin if at all possible as pt prefers not to have an epidural. Discussed w/ Dr. Normand Sloop. Couple was given the option to 1) stay for IOL w/ Pitocin, or 2) stay for antenatal testing w/ return for induction in the a.m. Couple elected the latter. Discussed best time w/ charge nurse -- couple instructed to arrive between 7:30-8:00 AM. Couple verbalized understanding of the risks associated w/ their decision to leave after NST, to include fetal death, and stated they were willing to assume such risks. These same risks were reviewed should they decide not to arrive for the scheduled induction in the am.  Dr. Normand Sloop notified of couple's decision.   Cat 1 FHRT obtained prior to discharge. Labor precautions/strict FKCs reviewed. Advised increase in po fluids.   Mayford Knife, Lemario Chaikin 02/23/16, 10:30 PM

## 2016-02-24 NOTE — Anesthesia Pain Management Evaluation Note (Signed)
  [facility] Pain Management Visit Note  Patient: [name], 35 y.o., female  "Hello I am a member of the anesthesia team at [facility]. We have an anesthesia team available at all times to provide care throughout the hospital, including epidural management and anesthesia for C-section. I don't know your plan for the delivery whether it a natural birth, water birth, IV sedation, nitrous supplementation, doula or epidural, but we want to meet your pain goals."   1.Was your pain managed to your expectations on prior hospitalizations?     2.What is your expectation for pain management during this hospitalization?       3.How can we help you reach that goal?   Record the patient's initial score and the patient's pain goal.   Pain: 0  Pain Goal: 10 [facility] wants you to be able to say your pain was always managed very well.  Laban Emperor 02/24/2016

## 2016-02-25 ENCOUNTER — Inpatient Hospital Stay (HOSPITAL_COMMUNITY): Payer: BLUE CROSS/BLUE SHIELD | Admitting: Anesthesiology

## 2016-02-25 ENCOUNTER — Encounter (HOSPITAL_COMMUNITY): Payer: Self-pay | Admitting: *Deleted

## 2016-02-25 LAB — RPR: RPR Ser Ql: NONREACTIVE

## 2016-02-25 MED ORDER — SIMETHICONE 80 MG PO CHEW: 80.0000 mg | CHEWABLE_TABLET | ORAL | Status: DC | PRN

## 2016-02-25 MED ORDER — FENTANYL 2.5 MCG/ML BUPIVACAINE 1/10 % EPIDURAL INFUSION (WH - ANES)
14.0000 mL/h | INTRAMUSCULAR | Status: DC | PRN
  Administered 2016-02-25: 14 mL/h via EPIDURAL
  Filled 2016-02-25: qty 125

## 2016-02-25 MED ORDER — ONDANSETRON HCL 4 MG PO TABS: 4.0000 mg | ORAL_TABLET | ORAL | Status: DC | PRN

## 2016-02-25 MED ORDER — DIPHENHYDRAMINE HCL 25 MG PO CAPS: 25.0000 mg | ORAL_CAPSULE | Freq: Four times a day (QID) | ORAL | Status: DC | PRN

## 2016-02-25 MED ORDER — WITCH HAZEL-GLYCERIN EX PADS: 1.0000 "application " | MEDICATED_PAD | CUTANEOUS | Status: DC | PRN

## 2016-02-25 MED ORDER — DIPHENHYDRAMINE HCL 50 MG/ML IJ SOLN: 12.5000 mg | INTRAMUSCULAR | Status: DC | PRN

## 2016-02-25 MED ORDER — DIBUCAINE 1 % RE OINT: 1.0000 "application " | TOPICAL_OINTMENT | RECTAL | Status: DC | PRN

## 2016-02-25 MED ORDER — LIDOCAINE HCL (PF) 1 % IJ SOLN
INTRAMUSCULAR | Status: DC | PRN
  Administered 2016-02-25: 4 mL
  Administered 2016-02-25: 6 mL via EPIDURAL

## 2016-02-25 MED ORDER — METHYLERGONOVINE MALEATE 0.2 MG PO TABS: 0.2000 mg | ORAL_TABLET | ORAL | Status: DC | PRN

## 2016-02-25 MED ORDER — ACETAMINOPHEN 325 MG PO TABS: 650.0000 mg | ORAL_TABLET | ORAL | Status: DC | PRN

## 2016-02-25 MED ORDER — HYDROXYZINE HCL 50 MG PO TABS
50.0000 mg | ORAL_TABLET | ORAL | Status: AC
  Administered 2016-02-25: 50 mg via ORAL
  Filled 2016-02-25: qty 1

## 2016-02-25 MED ORDER — ONDANSETRON HCL 4 MG/2ML IJ SOLN: 4.0000 mg | INTRAMUSCULAR | Status: DC | PRN

## 2016-02-25 MED ORDER — OXYCODONE-ACETAMINOPHEN 5-325 MG PO TABS: 1.0000 | ORAL_TABLET | ORAL | Status: DC | PRN

## 2016-02-25 MED ORDER — PHENYLEPHRINE 40 MCG/ML (10ML) SYRINGE FOR IV PUSH (FOR BLOOD PRESSURE SUPPORT)
80.0000 ug | PREFILLED_SYRINGE | INTRAVENOUS | Status: DC | PRN
  Filled 2016-02-25: qty 5

## 2016-02-25 MED ORDER — LACTATED RINGERS IV SOLN
500.0000 mL | Freq: Once | INTRAVENOUS | Status: AC
  Administered 2016-02-25: 500 mL via INTRAVENOUS

## 2016-02-25 MED ORDER — OXYTOCIN 40 UNITS IN LACTATED RINGERS INFUSION - SIMPLE MED: 2.5000 [IU]/h | INTRAVENOUS | Status: DC | PRN

## 2016-02-25 MED ORDER — EPHEDRINE 5 MG/ML INJ
10.0000 mg | INTRAVENOUS | Status: DC | PRN
  Filled 2016-02-25: qty 4

## 2016-02-25 MED ORDER — IBUPROFEN 600 MG PO TABS
600.0000 mg | ORAL_TABLET | Freq: Four times a day (QID) | ORAL | Status: DC
  Administered 2016-02-25 – 2016-02-26 (×4): 600 mg via ORAL
  Filled 2016-02-25 (×4): qty 1

## 2016-02-25 MED ORDER — METHYLERGONOVINE MALEATE 0.2 MG/ML IJ SOLN: 0.2000 mg | INTRAMUSCULAR | Status: DC | PRN

## 2016-02-25 MED ORDER — OXYTOCIN 40 UNITS IN LACTATED RINGERS INFUSION - SIMPLE MED
1.0000 m[IU]/min | INTRAVENOUS | Status: DC
  Administered 2016-02-25: 2 m[IU]/min via INTRAVENOUS
  Filled 2016-02-25: qty 1000

## 2016-02-25 MED ORDER — MAGNESIUM HYDROXIDE 400 MG/5ML PO SUSP: 30.0000 mL | ORAL | Status: DC | PRN

## 2016-02-25 MED ORDER — PHENYLEPHRINE 40 MCG/ML (10ML) SYRINGE FOR IV PUSH (FOR BLOOD PRESSURE SUPPORT)
80.0000 ug | PREFILLED_SYRINGE | INTRAVENOUS | Status: DC | PRN
  Filled 2016-02-25: qty 5
  Filled 2016-02-25: qty 10

## 2016-02-25 MED ORDER — COCONUT OIL OIL: 1.0000 "application " | TOPICAL_OIL | Status: DC | PRN

## 2016-02-25 MED ORDER — SENNOSIDES-DOCUSATE SODIUM 8.6-50 MG PO TABS
2.0000 | ORAL_TABLET | ORAL | Status: DC
  Administered 2016-02-25: 2 via ORAL
  Filled 2016-02-25: qty 2

## 2016-02-25 MED ORDER — BENZOCAINE-MENTHOL 20-0.5 % EX AERO: 1.0000 "application " | INHALATION_SPRAY | CUTANEOUS | Status: DC | PRN

## 2016-02-25 MED ORDER — PRENATAL MULTIVITAMIN CH
1.0000 | ORAL_TABLET | Freq: Every day | ORAL | Status: DC
  Filled 2016-02-25: qty 1

## 2016-02-25 MED ORDER — TETANUS-DIPHTH-ACELL PERTUSSIS 5-2.5-18.5 LF-MCG/0.5 IM SUSP: 0.5000 mL | Freq: Once | INTRAMUSCULAR | Status: DC

## 2016-02-25 MED ORDER — ZOLPIDEM TARTRATE 5 MG PO TABS: 5.0000 mg | ORAL_TABLET | Freq: Every evening | ORAL | Status: DC | PRN

## 2016-02-25 MED ORDER — OXYCODONE-ACETAMINOPHEN 5-325 MG PO TABS
2.0000 | ORAL_TABLET | ORAL | Status: DC | PRN
Start: 2016-02-25 — End: 2016-02-26

## 2016-02-25 NOTE — Progress Notes (Signed)
  Subjective: Sleeping soundly.  Objective: BP (!) 107/54   Pulse 61   Temp 98 F (36.7 C) (Oral)   Resp 18   Ht 5\' 10"  (1.778 m)   Wt 88.9 kg (196 lb)   LMP 05/13/2015   BMI 28.12 kg/m  No intake/output data recorded. No intake/output data recorded.  FHT: Category 1 UC:   regular, every 3-4 minutes SVE:   Dilation: 4 Effacement (%): 70 Station: -1 Exam by:: Nigel Bridgeman CNM at 3:30a Pitocin at 2 mu/min  Assessment:  Induction for oligo LGA GBS positive  Plan: Continue current care.  Nigel Bridgeman CNM 02/25/2016, 4:58 AM

## 2016-02-25 NOTE — Lactation Note (Signed)
This note was copied from a baby's chart. Lactation Consultation Note Initial visit at 6 hours of age.  Mom has 3 older children who breastfed well.  Mom denies concerns with this baby.  Mom reports a few good feedings and baby is asleep now.  [facility] LC resources given and discussed.  Encouraged to feed with early cues on demand.  Early newborn behavior discussed.  Hand expression reported by mom with colostrum visible.  Mom to call for assist as needed.    Patient Name: [name] [id number] Date: 02/25/2016 Reason for consult: Initial assessment   Maternal Data Has patient been taught Hand Expression?: Yes Does the patient have breastfeeding experience prior to this delivery?: Yes  Feeding Feeding Type: Breast Fed Length of feed: 15 min  LATCH Score/Interventions Latch: Repeated attempts needed to sustain latch, nipple held in mouth throughout feeding, stimulation needed to elicit sucking reflex.  Audible Swallowing: None Intervention(s): Skin to skin  Type of Nipple: Everted at rest and after stimulation  Comfort (Breast/Nipple): Soft / non-tender     Hold (Positioning): No assistance needed to correctly position infant at breast. Intervention(s): Breastfeeding basics reviewed  LATCH Score: 7  Lactation Tools Discussed/Used     Consult Status Consult Status: Follow-up Date: 02/26/16 Follow-up type: In-patient    Jannifer Rodney 02/25/2016, 8:07 PM

## 2016-02-25 NOTE — Anesthesia Procedure Notes (Signed)

## 2016-02-25 NOTE — Progress Notes (Signed)
  Subjective: Resting comfortably--occasional cramping, but no significant change since AROM at 11:36p  Objective: BP (!) 122/58   Pulse 76   Temp 97.7 F (36.5 C) (Oral)   Ht 5\' 10"  (1.778 m)   Wt 88.9 kg (196 lb)   LMP 05/13/2015   BMI 28.12 kg/m  No intake/output data recorded. No intake/output data recorded.  FHT: Category 1 UC:   Occasional SVE:   Dilation: 4 Effacement (%): 70 Station: -1, -2 Exam by:: Nigel Bridgeman CNM at 11:36p   Assessment:  Induction for oligohydramnios GBS positive  Plan: Reviewed status and options for breast pump to stimulate UCs, po [employer], or pitocin.  Patient still hoping to avoid pitocin, "feels anxious about everything". Offered Vistaril 50 mg po for anxiety. Will start with breast pump, then move forward from there. Discussed potential for utilization of pitocin to "jump start" labor, then d/c and observe labor progression.  Patient will start with breast pump per her choice.  Carlisle Torgeson CNM 02/25/2016, 1:08 AM

## 2016-02-25 NOTE — Progress Notes (Addendum)
  Subjective: Comfortable with epidural, but had anxiety attack after epidural placed--reports she had friend who died from AE embolism, "so when I feel anything funny, I freak out".  Reassured patient regarding stability of vital signs, O2 sat, FHR status.  Objective: BP 114/77   Pulse 90   Temp 98.5 F (36.9 C) (Oral)   Resp 18   Ht 5\' 10"  (1.778 m)   Wt 88.9 kg (196 lb)   LMP 05/13/2015   SpO2 100%   BMI 28.12 kg/m  No intake/output data recorded. No intake/output data recorded.  FHT: Category 1 UC:   regular, every 2-3 minutes SVE:   5-6 cm, 70%, vtx, -1/-2, well-applied, still slightly posterior. Pitocin at 6 mu/min.  Assessment:  Induction for oligo GBS positive LGA fetus Anxiety Active labor  Plan: Continue to observe.  Support to patient for concerns and anxieties. Continue augmentation.  Nigel Bridgeman CNM 02/25/2016, 6:42 AM

## 2016-02-25 NOTE — Anesthesia Preprocedure Evaluation (Signed)

## 2016-02-25 NOTE — Progress Notes (Signed)
[name] is a 35 y.o. [id number] at [redacted]w[redacted]d   Subjective: Pt is comfortable.  Concerns about not being able to feel when pushing.  Objective: BP (!) 95/58   Pulse 75   Temp 98.6 F (37 C) (Oral)   Resp 18   Ht 5\' 10"  (1.778 m)   Wt 88.9 kg (196 lb)   LMP 05/13/2015   SpO2 99%   BMI 28.12 kg/m  No intake/output data recorded. No intake/output data recorded.  FHT:  FHR: 130s-140s bpm, variability: moderate,  accelerations:  Present,  decelerations:  Present early, variable UC:   regular, every 2-3 minutes SVE:   Dilation: 6.5 Effacement (%): 70 Station: -1, -2 Exam by:: Ori Kreiter  Pelvis is very adequate. Leopolds 9 lbs.  IUPC placed due to minimal cervical .  Labs: Lab Results  Component Value Date   WBC 10.6 (H) 02/24/2016   HGB 11.5 (L) 02/24/2016   HCT 33.6 (L) 02/24/2016   MCV 84.4 02/24/2016   PLT 198 02/24/2016    Assessment / Plan: IUP @ 41 1/7 weeks IOL due to Oligohydramnios H/o Cesarean section.  Last 2 deliveries have been via successful VBACs. Suspected macrosomia, adequate pelvis.  Labor: Not progressing as I would expect but suspect low MVUs.  Increase Pitocin prn until MVUs are 180-200. Preeclampsia:  Normal BP. Fetal Wellbeing:  Cat II but overall reassuring Pain Control:  Epidural I/D:  GBS positive on PCN. Anticipated MOD:  NSVD  Eh Sauseda 02/25/2016, 9:54 AM

## 2016-02-25 NOTE — Progress Notes (Signed)
  Subjective: "Ready to start pitocin"--tried breast pump for stimulation, no significant change in UCs, very mild, occasional to patient perception.  Continues to leak clear fluid.  Objective: BP 112/64   Pulse 71   Temp 98.4 F (36.9 C) (Oral)   Ht 5\' 10"  (1.778 m)   Wt 88.9 kg (196 lb)   LMP 05/13/2015   BMI 28.12 kg/m  No intake/output data recorded. No intake/output data recorded.  FHT: Category 1 UC:   q 2-5 min, mild. SVE:   Dilation: 4 Effacement (%): 70 Station: -1 Exam by:: Nigel Bridgeman CNM--cervix slightly more anterior.   Assessment:  Induction for oligo GBS Has been reluctant to accept pitocin LGA fetus, hx LGA fetus x 2 (9+ lbs) Previous C/S, with 2 subsequent VBACs AMA 41 weeks  Plan: Will start pitocin per low-dose protocol.  Advised patient one option is to "get labor going" with pitocin, then d/c to allow for spontaneous progression. Support to patient for anxieties and concerns. Patient may want epidural as labor progresses.  Nigel Bridgeman CNM 02/25/2016, 3:34 AM

## 2016-02-26 LAB — CBC
HCT: 28.9 % — ABNORMAL LOW (ref 36.0–46.0)
Hemoglobin: 9.6 g/dL — ABNORMAL LOW (ref 12.0–15.0)
MCH: 28.2 pg (ref 26.0–34.0)
MCHC: 33.2 g/dL (ref 30.0–36.0)
MCV: 84.8 fL (ref 78.0–100.0)
Platelets: 164 10*3/uL (ref 150–400)
RBC: 3.41 MIL/uL — ABNORMAL LOW (ref 3.87–5.11)
RDW: 13.4 % (ref 11.5–15.5)
WBC: 13.5 10*3/uL — ABNORMAL HIGH (ref 4.0–10.5)

## 2016-02-26 NOTE — Lactation Note (Signed)
This note was copied from a baby's chart. Lactation Consultation Note: Experienced BF mom reports baby has been latching well. Mom with large nipples. Baby opened wide and latched well. Encouraged mom to keep baby close to the breast throughout the feeding- she is letting her slide to tip of nipple- so she has room to breathe. Mom reports she is able to hand express Colostrum. Still nursing as I left room. No questions at present. Reviewed our phone number to call with questions/concerns  Patient Name: [name] ZOXWR'UToday's Date: 02/26/2016 Reason for consult: Follow-up assessment   Maternal Data Formula Feeding for Exclusion: No Has patient been taught Hand Expression?: Yes Does the patient have breastfeeding experience prior to this delivery?: Yes  Feeding Feeding Type: Breast Fed  LATCH Score/Interventions Latch: Grasps breast easily, tongue down, lips flanged, rhythmical sucking.  Audible Swallowing: A few with stimulation  Type of Nipple: Everted at rest and after stimulation  Comfort (Breast/Nipple): Soft / non-tender     Hold (Positioning): No assistance needed to correctly position infant at breast.  LATCH Score: 9  Lactation Tools Discussed/Used     Consult Status Consult Status: Complete    Pamelia HoitWeeks, Sayed Apostol D 02/26/2016, 11:13 AM

## 2016-02-26 NOTE — Discharge Instructions (Signed)
Breastfeeding and Mastitis Mastitis is inflammation of the breast tissue. It can occur in women who are breastfeeding. This can make breastfeeding painful. Mastitis will sometimes go away on its own. Your health care provider will help determine if treatment is needed. CAUSES Mastitis is often associated with a blocked milk (lactiferous) duct. This can happen when too much milk builds up in the breast. Causes of excess milk in the breast can include:  Poor latch-on. If your baby is not latched onto the breast properly, she or he may not empty your breast completely while breastfeeding.  Allowing too much time to pass between feedings.  Wearing a bra or other clothing that is too tight. This puts extra pressure on the lactiferous ducts so milk does not flow through them as it should. Mastitis can also be caused by a bacterial infection. Bacteria may enter the breast tissue through cuts or openings in the skin. In women who are breastfeeding, this may occur because of cracked or irritated skin. Cracks in the skin are often caused when your baby does not latch on properly to the breast. SIGNS AND SYMPTOMS  Swelling, redness, tenderness, and pain in an area of the breast.  Swelling of the glands under the arm on the same side.  Fever may or may not accompany mastitis. If an infection is allowed to progress, a collection of pus (abscess) may develop. DIAGNOSIS  Your health care provider can usually diagnose mastitis based on your symptoms and a physical exam. Tests may be done to help confirm the diagnosis. These may include:  Removal of pus from the breast by applying pressure to the area. This pus can be examined in the lab to determine which bacteria are present. If an abscess has developed, the fluid in the abscess can be removed with a needle. This can also be used to confirm the diagnosis and determine the bacteria present. In most cases, pus will not be present.  Blood tests to determine if  your body is fighting a bacterial infection.  Mammogram or ultrasound tests to rule out other problems or diseases. TREATMENT  Mastitis that occurs with breastfeeding will sometimes go away on its own. Your health care provider may choose to wait 24 hours after first seeing you to decide whether a prescription medicine is needed. If your symptoms are worse after 24 hours, your health care provider will likely prescribe an antibiotic medicine to treat the mastitis. He or she will determine which bacteria are most likely causing the infection and will then select an appropriate antibiotic medicine. This is sometimes changed based on the results of tests performed to identify the bacteria, or if there is no response to the antibiotic medicine selected. Antibiotic medicines are usually given by mouth. You may also be given medicine for pain. HOME CARE INSTRUCTIONS  Only take over-the-counter or prescription medicines for pain, fever, or discomfort as directed by your health care provider.  If your health care provider prescribed an antibiotic medicine, take the medicine as directed. Make sure you finish it even if you start to feel better.  Do not wear a tight or underwire bra. Wear a soft, supportive bra.  Increase your fluid intake, especially if you have a fever.  Continue to empty the breast. Your health care provider can tell you whether this milk is safe for your infant or needs to be thrown out. You may be told to stop nursing until your health care provider thinks it is safe for your baby.  Use a breast pump if you are advised to stop nursing.  Keep your nipples clean and dry.  Empty the first breast completely before going to the other breast. If your baby is not emptying your breasts completely for some reason, use a breast pump to empty your breasts.  If you go back to work, pump your breasts while at work to stay in time with your nursing schedule.  Avoid allowing your breasts to become  overly filled with milk (engorged). SEEK MEDICAL CARE IF:  You have pus-like discharge from the breast.  Your symptoms do not improve with the treatment prescribed by your health care provider within 2 days. SEEK IMMEDIATE MEDICAL CARE IF:  Your pain and swelling are getting worse.  You have pain that is not controlled with medicine.  You have a red line extending from the breast toward your armpit.  You have a fever or persistent symptoms for more than 2-3 days.  You have a fever and your symptoms suddenly get worse. MAKE SURE YOU:   Understand these instructions.  Will watch your condition.  Will get help right away if you are not doing well or get worse.   This information is not intended to replace advice given to you by your health care provider. Make sure you discuss any questions you have with your health care provider.   Document Released: 11/03/2004 Document Revised: 07/14/2013 Document Reviewed: 02/12/2013 Elsevier Interactive Patient Education [employer]. Breastfeeding Deciding to breastfeed is one of the best choices you can make for you and your baby. A change in hormones during pregnancy causes your breast tissue to grow and increases the number and size of your milk ducts. These hormones also allow proteins, sugars, and fats from your blood supply to make breast milk in your milk-producing glands. Hormones prevent breast milk from being released before your baby is born as well as prompt milk flow after birth. Once breastfeeding has begun, thoughts of your baby, as well as his or her sucking or crying, can stimulate the release of milk from your milk-producing glands.  BENEFITS OF BREASTFEEDING For Your Baby  Your first milk (colostrum) helps your baby's digestive system function better.  There are antibodies in your milk that help your baby fight off infections.  Your baby has a lower incidence of asthma, allergies, and sudden infant death  syndrome.  The nutrients in breast milk are better for your baby than infant formulas and are designed uniquely for your baby's needs.  Breast milk improves your baby's brain development.  Your baby is less likely to develop other conditions, such as childhood obesity, asthma, or type 2 diabetes mellitus. For You  Breastfeeding helps to create a very special bond between you and your baby.  Breastfeeding is convenient. Breast milk is always available at the correct temperature and costs nothing.  Breastfeeding helps to burn calories and helps you lose the weight gained during pregnancy.  Breastfeeding makes your uterus contract to its prepregnancy size faster and slows bleeding (lochia) after you give birth.   Breastfeeding helps to lower your risk of developing type 2 diabetes mellitus, osteoporosis, and breast or ovarian cancer later in life. SIGNS THAT YOUR BABY IS HUNGRY Early Signs of Hunger  Increased alertness or activity.  Stretching.  Movement of the head from side to side.  Movement of the head and opening of the mouth when the corner of the mouth or cheek is stroked (rooting).  Increased sucking sounds, smacking lips, cooing,  sighing, or squeaking.  Hand-to-mouth movements.  Increased sucking of fingers or hands. Late Signs of Hunger  Fussing.  Intermittent crying. Extreme Signs of Hunger Signs of extreme hunger will require calming and consoling before your baby will be able to breastfeed successfully. Do not wait for the following signs of extreme hunger to occur before you initiate breastfeeding:  Restlessness.  A loud, strong cry.  Screaming. BREASTFEEDING BASICS Breastfeeding Initiation  Find a comfortable place to sit or lie down, with your neck and back well supported.  Place a pillow or rolled up blanket under your baby to bring him or her to the level of your breast (if you are seated). Nursing pillows are specially designed to help support  your arms and your baby while you breastfeed.  Make sure that your baby's abdomen is facing your abdomen.  Gently massage your breast. With your fingertips, massage from your chest wall toward your nipple in a circular motion. This encourages milk flow. You may need to continue this action during the feeding if your milk flows slowly.  Support your breast with 4 fingers underneath and your thumb above your nipple. Make sure your fingers are well away from your nipple and your baby's mouth.  Stroke your baby's lips gently with your finger or nipple.  When your baby's mouth is open wide enough, quickly bring your baby to your breast, placing your entire nipple and as much of the colored area around your nipple (areola) as possible into your baby's mouth.  More areola should be visible above your baby's upper lip than below the lower lip.  Your baby's tongue should be between his or her lower gum and your breast.  Ensure that your baby's mouth is correctly positioned around your nipple (latched). Your baby's lips should create a seal on your breast and be turned out (everted).  It is common for your baby to suck about 2-3 minutes in order to start the flow of breast milk. Latching Teaching your baby how to latch on to your breast properly is very important. An improper latch can cause nipple pain and decreased milk supply for you and poor weight gain in your baby. Also, if your baby is not latched onto your nipple properly, he or she may swallow some air during feeding. This can make your baby fussy. Burping your baby when you switch breasts during the feeding can help to get rid of the air. However, teaching your baby to latch on properly is still the best way to prevent fussiness from swallowing air while breastfeeding. Signs that your baby has successfully latched on to your nipple:  Silent tugging or silent sucking, without causing you pain.  Swallowing heard between every 3-4  sucks.  Muscle movement above and in front of his or her ears while sucking. Signs that your baby has not successfully latched on to nipple:  Sucking sounds or smacking sounds from your baby while breastfeeding.  Nipple pain. If you think your baby has not latched on correctly, slip your finger into the corner of your baby's mouth to break the suction and place it between your baby's gums. Attempt breastfeeding initiation again. Signs of Successful Breastfeeding Signs from your baby:  A gradual decrease in the number of sucks or complete cessation of sucking.  Falling asleep.  Relaxation of his or her body.  Retention of a small amount of milk in his or her mouth.  Letting go of your breast by himself or herself. Signs from you:  Breasts that have increased in firmness, weight, and size 1-3 hours after feeding.  Breasts that are softer immediately after breastfeeding.  Increased milk volume, as well as a change in milk consistency and color by the fifth day of breastfeeding.  Nipples that are not sore, cracked, or bleeding. Signs That Your [employer] is Getting Enough Milk  Wetting at least 3 diapers in a 24-hour period. The urine should be clear and pale yellow by age 66 days.  At least 3 stools in a 24-hour period by age 66 days. The stool should be soft and yellow.  At least 3 stools in a 24-hour period by age 80 days. The stool should be seedy and yellow.  No loss of weight greater than 10% of birth weight during the first 1 days of age.  Average weight gain of 4-7 ounces (113-198 g) per week after age 30 days.  Consistent daily weight gain by age 82 days, without weight loss after the age of 2 weeks. After a feeding, your baby may spit up a small amount. This is common. BREASTFEEDING FREQUENCY AND DURATION Frequent feeding will help you make more milk and can prevent sore nipples and breast engorgement. Breastfeed when you feel the need to reduce the fullness of your breasts or  when your baby shows signs of hunger. This is called "breastfeeding on demand." Avoid introducing a pacifier to your baby while you are working to establish breastfeeding (the first 4-6 weeks after your baby is born). After this time you may choose to use a pacifier. Research has shown that pacifier use during the first year of a baby's life decreases the risk of sudden infant death syndrome (SIDS). Allow your baby to feed on each breast as long as he or she wants. Breastfeed until your baby is finished feeding. When your baby unlatches or falls asleep while feeding from the first breast, offer the second breast. Because newborns are often sleepy in the first few weeks of life, you may need to awaken your baby to get him or her to feed. Breastfeeding times will vary from baby to baby. However, the following rules can serve as a guide to help you ensure that your baby is properly fed:  Newborns (babies 82 weeks of age or younger) may breastfeed every 1-3 hours.  Newborns should not go longer than 3 hours during the day or 5 hours during the night without breastfeeding.  You should breastfeed your baby a minimum of 8 times in a 24-hour period until you begin to introduce solid foods to your baby at around 56 months of age. BREAST MILK PUMPING Pumping and storing breast milk allows you to ensure that your baby is exclusively fed your breast milk, even at times when you are unable to breastfeed. This is especially important if you are going back to work while you are still breastfeeding or when you are not able to be present during feedings. Your lactation consultant can give you guidelines on how long it is safe to store breast milk. A breast pump is a machine that allows you to pump milk from your breast into a sterile bottle. The pumped breast milk can then be stored in a refrigerator or freezer. Some breast pumps are operated by hand, while others use electricity. Ask your lactation consultant which type  will work best for you. Breast pumps can be purchased, but some hospitals and breastfeeding support groups lease breast pumps on a monthly basis. A lactation consultant can teach you  how to hand express breast milk, if you prefer not to use a pump. CARING FOR YOUR BREASTS WHILE YOU BREASTFEED Nipples can become dry, cracked, and sore while breastfeeding. The following recommendations can help keep your breasts moisturized and healthy:  Avoid using soap on your nipples.  Wear a supportive bra. Although not required, special nursing bras and tank tops are designed to allow access to your breasts for breastfeeding without taking off your entire bra or top. Avoid wearing underwire-style bras or extremely tight bras.  Air dry your nipples for 3-75mnutes after each feeding.  Use only cotton bra pads to absorb leaked breast milk. Leaking of breast milk between feedings is normal.  Use lanolin on your nipples after breastfeeding. Lanolin helps to maintain your skin's normal moisture barrier. If you use pure lanolin, you do not need to wash it off before feeding your baby again. Pure lanolin is not toxic to your baby. You may also hand express a few drops of breast milk and gently massage that milk into your nipples and allow the milk to air dry. In the first few weeks after giving birth, some women experience extremely full breasts (engorgement). Engorgement can make your breasts feel heavy, warm, and tender to the touch. Engorgement peaks within 3-5 days after you give birth. The following recommendations can help ease engorgement:  Completely empty your breasts while breastfeeding or pumping. You may want to start by applying warm, moist heat (in the shower or with warm water-soaked hand towels) just before feeding or pumping. This increases circulation and helps the milk flow. If your baby does not completely empty your breasts while breastfeeding, pump any extra milk after he or she is finished.  Wear  a snug bra (nursing or regular) or tank top for 1-2 days to signal your body to slightly decrease milk production.  Apply ice packs to your breasts, unless this is too uncomfortable for you.  Make sure that your baby is latched on and positioned properly while breastfeeding. If engorgement persists after 48 hours of following these recommendations, contact your health care provider or a lScience writer OVERALL HEALTH CARE RECOMMENDATIONS WHILE BREASTFEEDING  Eat healthy foods. Alternate between meals and snacks, eating 3 of each per day. Because what you eat affects your breast milk, some of the foods may make your baby more irritable than usual. Avoid eating these foods if you are sure that they are negatively affecting your baby.  Drink milk, fruit juice, and water to satisfy your thirst (about 10 glasses a day).  Rest often, relax, and continue to take your prenatal vitamins to prevent fatigue, stress, and anemia.  Continue breast self-awareness checks.  Avoid chewing and smoking tobacco. Chemicals from cigarettes that pass into breast milk and exposure to secondhand smoke may harm your baby.  Avoid alcohol and drug use, including marijuana. Some medicines that may be harmful to your baby can pass through breast milk. It is important to ask your health care provider before taking any medicine, including all over-the-counter and prescription medicine as well as vitamin and herbal supplements. It is possible to become pregnant while breastfeeding. If birth control is desired, ask your health care provider about options that will be safe for your baby. SEEK MEDICAL CARE IF:  You feel like you want to stop breastfeeding or have become frustrated with breastfeeding.  You have painful breasts or nipples.  Your nipples are cracked or bleeding.  Your breasts are red, tender, or warm.  You have a  swollen area on either breast.  You have a fever or chills.  You have nausea or  vomiting.  You have drainage other than breast milk from your nipples.  Your breasts do not become full before feedings by the fifth day after you give birth.  You feel sad and depressed.  Your baby is too sleepy to eat well.  Your baby is having trouble sleeping.   Your baby is wetting less than 3 diapers in a 24-hour period.  Your baby has less than 3 stools in a 24-hour period.  Your baby's skin or the white part of his or her eyes becomes yellow.   Your baby is not gaining weight by 44 days of age. SEEK IMMEDIATE MEDICAL CARE IF:  Your baby is overly tired (lethargic) and does not want to wake up and feed.  Your baby develops an unexplained fever.   This information is not intended to replace advice given to you by your health care provider. Make sure you discuss any questions you have with your health care provider.   Document Released: 07/09/2005 Document Revised: 03/30/2015 Document Reviewed: 12/31/2012 Elsevier Interactive Patient Education 2016 [employer]. Postpartum Depression and Baby Blues The postpartum period begins right after the birth of a baby. During this time, there is often a great amount of joy and excitement. It is also a time of many changes in the life of the parents. Regardless of how many times a mother gives birth, each child brings new challenges and dynamics to the family. It is not unusual to have feelings of excitement along with confusing shifts in moods, emotions, and thoughts. All mothers are at risk of developing postpartum depression or the "baby blues." These mood changes can occur right after giving birth, or they may occur many months after giving birth. The baby blues or postpartum depression can be mild or severe. Additionally, postpartum depression can go away rather quickly, or it can be a long-term condition.  CAUSES Raised hormone levels and the rapid drop in those levels are thought to be a main cause of postpartum depression and the  baby blues. A number of hormones change during and after pregnancy. Estrogen and progesterone usually decrease right after the delivery of your baby. The levels of thyroid hormone and various cortisol steroids also rapidly drop. Other factors that play a role in these mood changes include major life events and genetics.  RISK FACTORS If you have any of the following risks for the baby blues or postpartum depression, know what symptoms to watch out for during the postpartum period. Risk factors that may increase the likelihood of getting the baby blues or postpartum depression include:  Having a personal or family history of depression.   Having depression while being pregnant.   Having premenstrual mood issues or mood issues related to oral contraceptives.  Having a lot of life stress.   Having marital conflict.   Lacking a social support network.   Having a baby with special needs.   Having health problems, such as diabetes.  SIGNS AND SYMPTOMS Symptoms of baby blues include:  Brief changes in mood, such as going from extreme happiness to sadness.  Decreased concentration.   Difficulty sleeping.   Crying spells, tearfulness.   Irritability.   Anxiety.  Symptoms of postpartum depression typically begin within the first month after giving birth. These symptoms include:  Difficulty sleeping or excessive sleepiness.   Marked weight loss.   Agitation.   Feelings of worthlessness.  Lack of interest in activity or food.  Postpartum psychosis is a very serious condition and can be dangerous. Fortunately, it is rare. Displaying any of the following symptoms is cause for immediate medical attention. Symptoms of postpartum psychosis include:   Hallucinations and delusions.   Bizarre or disorganized behavior.   Confusion or disorientation.  DIAGNOSIS  A diagnosis is made by an evaluation of your symptoms. There are no medical or lab tests that lead to a  diagnosis, but there are various questionnaires that a health care provider may use to identify those with the baby blues, postpartum depression, or psychosis. Often, a screening tool called the [place] Postnatal Depression Scale is used to diagnose depression in the postpartum period.  TREATMENT The baby blues usually goes away on its own in 1-2 weeks. Social support is often all that is needed. You will be encouraged to get adequate sleep and rest. Occasionally, you may be given medicines to help you sleep.  Postpartum depression requires treatment because it can last several months or longer if it is not treated. Treatment may include individual or group therapy, medicine, or both to address any social, physiological, and psychological factors that may play a role in the depression. Regular exercise, a healthy diet, rest, and social support may also be strongly recommended.  Postpartum psychosis is more serious and needs treatment right away. Hospitalization is often needed. HOME CARE INSTRUCTIONS  Get as much rest as you can. Nap when the baby sleeps.   Exercise regularly. Some women find yoga and walking to be beneficial.   Eat a balanced and nourishing diet.   Do little things that you enjoy. Have a cup of tea, take a bubble bath, read your favorite magazine, or listen to your favorite music.  Avoid alcohol.   Ask for help with household chores, cooking, grocery shopping, or running errands as needed. Do not try to do everything.   Talk to people close to you about how you are feeling. Get support from your partner, family members, friends, or other new moms.  Try to stay positive in how you think. Think about the things you are grateful for.   Do not spend a lot of time alone.   Only take over-the-counter or prescription medicine as directed by your health care provider.  Keep all your postpartum appointments.   Let your health care provider know if you have any  concerns.  SEEK MEDICAL CARE IF: You are having a reaction to or problems with your medicine. SEEK IMMEDIATE MEDICAL CARE IF:  You have suicidal feelings.   You think you may harm the baby or someone else. MAKE SURE YOU:  Understand these instructions.  Will watch your condition.  Will get help right away if you are not doing well or get worse.   This information is not intended to replace advice given to you by your health care provider. Make sure you discuss any questions you have with your health care provider.   Document Released: 04/12/2004 Document Revised: 07/14/2013 Document Reviewed: 04/20/2013 Elsevier Interactive Patient Education 2016 [employer]. Anemia, Nonspecific Anemia is a condition in which the concentration of red blood cells or hemoglobin in the blood is below normal. Hemoglobin is a substance in red blood cells that carries oxygen to the tissues of the body. Anemia results in not enough oxygen reaching these tissues.  CAUSES  Common causes of anemia include:   Excessive bleeding. Bleeding may be internal or external. This includes excessive bleeding from  periods (in women) or from the intestine.   Poor nutrition.   Chronic kidney, thyroid, and liver disease.  Bone marrow disorders that decrease red blood cell production.  Cancer and treatments for cancer.  HIV, AIDS, and their treatments.  Spleen problems that increase red blood cell destruction.  Blood disorders.  Excess destruction of red blood cells due to infection, medicines, and autoimmune disorders. SIGNS AND SYMPTOMS   Minor weakness.   Dizziness.   Headache.  Palpitations.   Shortness of breath, especially with exercise.   Paleness.  Cold sensitivity.  Indigestion.  Nausea.  Difficulty sleeping.  Difficulty concentrating. Symptoms may occur suddenly or they may develop slowly.  DIAGNOSIS  Additional blood tests are often needed. These help your health care  provider determine the best treatment. Your health care provider will check your stool for blood and look for other causes of blood loss.  TREATMENT  Treatment varies depending on the cause of the anemia. Treatment can include:   Supplements of iron, vitamin Z16, or folic acid.   Hormone medicines.   A blood transfusion. This may be needed if blood loss is severe.   Hospitalization. This may be needed if there is significant continual blood loss.   Dietary changes.  Spleen removal. HOME CARE INSTRUCTIONS Keep all follow-up appointments. It often takes many weeks to correct anemia, and having your health care provider check on your condition and your response to treatment is very important. SEEK IMMEDIATE MEDICAL CARE IF:   You develop extreme weakness, shortness of breath, or chest pain.   You become dizzy or have trouble concentrating.  You develop heavy vaginal bleeding.   You develop a rash.   You have bloody or black, tarry stools.   You faint.   You vomit up blood.   You vomit repeatedly.   You have abdominal pain.  You have a fever or persistent symptoms for more than 2-3 days.   You have a fever and your symptoms suddenly get worse.   You are dehydrated.  MAKE SURE YOU:  Understand these instructions.  Will watch your condition.  Will get help right away if you are not doing well or get worse.   This information is not intended to replace advice given to you by your health care provider. Make sure you discuss any questions you have with your health care provider.   Document Released: 08/16/2004 Document Revised: 03/11/2013 Document Reviewed: 01/02/2013 Elsevier Interactive Patient Education 2016 [employer]. Iron-Rich Diet Iron is a mineral that helps your body to produce hemoglobin. Hemoglobin is a protein in your red blood cells that carries oxygen to your body's tissues. Eating too little iron may cause you to feel weak and tired, and  it can increase your risk for infection. Eating enough iron is necessary for your body's metabolism, muscle function, and nervous system. Iron is naturally found in many foods. It can also be added to foods or fortified in foods. There are two types of dietary iron:  Heme iron. Heme iron is absorbed by the body more easily than nonheme iron. Heme iron is found in meat, poultry, and fish.  Nonheme iron. Nonheme iron is found in dietary supplements, iron-fortified grains, beans, and vegetables. You may need to follow an iron-rich diet if:  You have been diagnosed with iron deficiency or iron-deficiency anemia.  You have a condition that prevents you from absorbing dietary iron, such as:  Infection in your intestines.  Celiac disease. This involves long-lasting (chronic) inflammation  of your intestines.  You do not eat enough iron.  You eat a diet that is high in foods that impair iron absorption.  You have lost a lot of blood.  You have heavy bleeding during your menstrual cycle.  You are pregnant. WHAT IS MY PLAN? Your health care provider may help you to determine how much iron you need per day based on your condition. Generally, when a person consumes sufficient amounts of iron in the diet, the following iron needs are met:  Men.  77-64 years old: 11 mg per day.  39-30 years old: 8 mg per day.  Women.   8-32 years old: 15 mg per day.  55-57 years old: 18 mg per day.  Over 28 years old: 8 mg per day.  Pregnant women: 27 mg per day.  Breastfeeding women: 9 mg per day. WHAT DO I NEED TO KNOW ABOUT AN IRON-RICH DIET?  Eat fresh fruits and vegetables that are high in vitamin C along with foods that are high in iron. This will help increase the amount of iron that your body absorbs from food, especially with foods containing nonheme iron. Foods that are high in vitamin C include oranges, peppers, tomatoes, and mango.  Take iron supplements only as directed by your health  care provider. Overdose of iron can be life-threatening. If you were prescribed iron supplements, take them with orange juice or a vitamin C supplement.  Cook foods in pots and pans that are made from iron.   Eat nonheme iron-containing foods alongside foods that are high in heme iron. This helps to improve your iron absorption.   Certain foods and drinks contain compounds that impair iron absorption. Avoid eating these foods in the same meal as iron-rich foods or with iron supplements. These include:  Coffee, black tea, and red wine.  Milk, dairy products, and foods that are high in calcium.  Beans, soybeans, and peas.  Whole grains.  When eating foods that contain both nonheme iron and compounds that impair iron absorption, follow these tips to absorb iron better.   Soak beans overnight before cooking.  Soak whole grains overnight and drain them before using.  Ferment flours before baking, such as using yeast in bread dough. WHAT FOODS CAN I EAT? Grains Iron-fortified breakfast cereal. Iron-fortified whole-wheat bread. Enriched rice. Sprouted grains. Vegetables Spinach. Potatoes with skin. Green peas. Broccoli. Red and green bell peppers. Fermented vegetables. Fruits Prunes. Raisins. Oranges. Strawberries. Mango. Grapefruit. Meats and Other Protein Sources Beef liver. Oysters. Beef. Shrimp. [place]. Chicken. [place]. Sardines. Chickpeas. Nuts. Tofu. Beverages Tomato juice. Fresh orange juice. Prune juice. Hibiscus tea. Fortified instant breakfast shakes. Condiments Tahini. Fermented soy sauce. Sweets and Desserts Black-strap molasses.  Other Wheat germ. The items listed above may not be a complete list of recommended foods or beverages. Contact your dietitian for more options. WHAT FOODS ARE NOT RECOMMENDED? Grains Whole grains. Bran cereal. Bran flour. Oats. Vegetables Artichokes. Brussels sprouts. Kale. Fruits Blueberries. Raspberries. Strawberries.  Figs. Meats and Other Protein Sources Soybeans. Products made from soy protein. Dairy Milk. Cream. Cheese. Yogurt. Cottage cheese. Beverages Coffee. Black tea. Red wine. Sweets and Desserts Cocoa. Chocolate. Ice cream. Other Basil. Oregano. Parsley. The items listed above may not be a complete list of foods and beverages to avoid. Contact your dietitian for more information.   This information is not intended to replace advice given to you by your health care provider. Make sure you discuss any questions you have with your health care provider.  Document Released: 02/20/2005 Document Revised: 07/30/2014 Document Reviewed: 02/03/2014 Elsevier Interactive Patient Education 2016 Else[employer] Inc. Postpartum Care After Vaginal Delivery After you deliver your newborn (postpartum period), the usual stay in the hospital is 24-72 hours. If there were problems with your labor or delivery, or if you have other medical problems, you might be in the hospital longer.  While you are in the hospital, you will receive help and instructions on how to care for yourself and your newborn during the postpartum period.  While you are in the hospital:  Be sure to tell your nurses if you have pain or discomfort, as well as where you feel the pain and what makes the pain worse.  If you had an incision made near your vagina (episiotomy) or if you had some tearing during delivery, the nurses may put ice packs on your episiotomy or tear. The ice packs may help to reduce the pain and swelling.  If you are breastfeeding, you may feel uncomfortable contractions of your uterus for a couple of weeks. This is normal. The contractions help your uterus get back to normal size.  It is normal to have some bleeding after delivery.  For the first 1-3 days after delivery, the flow is red and the amount may be similar to a period.  It is common for the flow to start and stop.  In the first few days, you may pass some  small clots. Let your nurses know if you begin to pass large clots or your flow increases.  Do not  flush blood clots down the toilet before having the nurse look at them.  During the next 3-10 days after delivery, your flow should become more watery and pink or brown-tinged in color.  Ten to fourteen days after delivery, your flow should be a small amount of yellowish-white discharge.  The amount of your flow will decrease over the first few weeks after delivery. Your flow may stop in 6-8 weeks. Most women have had their flow stop by 12 weeks after delivery.  You should change your sanitary pads frequently.  Wash your hands thoroughly with soap and water for at least 20 seconds after changing pads, using the toilet, or before holding or feeding your newborn.  You should feel like you need to empty your bladder within the first 6-8 hours after delivery.  In case you become weak, lightheaded, or faint, call your nurse before you get out of bed for the first time and before you take a shower for the first time.  Within the first few days after delivery, your breasts may begin to feel tender and full. This is called engorgement. Breast tenderness usually goes away within 48-72 hours after engorgement occurs. You may also notice milk leaking from your breasts. If you are not breastfeeding, do not stimulate your breasts. Breast stimulation can make your breasts produce more milk.  Spending as much time as possible with your newborn is very important. During this time, you and your newborn can feel close and get to know each other. Having your newborn stay in your room (rooming in) will help to strengthen the bond with your newborn. It will give you time to get to know your newborn and become comfortable caring for your newborn.  Your hormones change after delivery. Sometimes the hormone changes can temporarily cause you to feel sad or tearful. These feelings should not last more than a few days. If  these feelings last longer than that, you should talk  to your caregiver.  If desired, talk to your caregiver about methods of family planning or contraception.  Talk to your caregiver about immunizations. Your caregiver may want you to have the following immunizations before leaving the hospital:  Tetanus, diphtheria, and pertussis (Tdap) or tetanus and diphtheria (Td) immunization. It is very important that you and your family (including grandparents) or others caring for your newborn are up-to-date with the Tdap or Td immunizations. The Tdap or Td immunization can help protect your newborn from getting ill.  Rubella immunization.  Varicella (chickenpox) immunization.  Influenza immunization. You should receive this annual immunization if you did not receive the immunization during your pregnancy.   This information is not intended to replace advice given to you by your health care provider. Make sure you discuss any questions you have with your health care provider.   Document Released: 05/06/2007 Document Revised: 04/02/2012 Document Reviewed: 03/05/2012 Elsevier Interactive Patient Education [employer].

## 2016-02-26 NOTE — Discharge Summary (Signed)
[facility] Discharge Summary   Patient Name:   [name] DOB:     26-Jan-1981 MRN:     [id number]  Date of Admission:   02/24/2016 Date of Discharge:  02/26/2016  Admitting diagnosis:    INDUCTION Active Problems:   Previous cesarean delivery, antepartum condition or complication, with subsequent VBAC   LGA (large for gestational age) fetus with last delivery   Positive GBS test   AMA (advanced maternal age) multigravida 35+   Post term pregnancy, 41 weeks   H/O rapid labor      Discharge diagnosis:    INDUCTION Active Problems:   Previous cesarean delivery, antepartum condition or complication, with subsequent VBAC   LGA (large for gestational age) fetus with last delivery   Positive GBS test   AMA (advanced maternal age) multigravida 35+   Post term pregnancy, 41 weeks   H/O rapid labor  Term Pregnancy Delivered, VBAC and Anemia                                                                     Post partum procedures: None  Type of Delivery:  SVD  Delivering Provider: Geryl RankinsVARNADO, Melody   Date of Delivery:  02/25/16  Newborn Data:    Live born female  Birth Weight: 9 lb 5.7 oz (4245 g) APGARS: 8, 9  Baby's Name:               Melody Georgeseve Baby Feeding:   Breast Disposition:   home with mother  Complications:   None  Hospital course:      Induction of Labor With Vaginal Delivery   35 y.o. yo [id number] at 4235w1d was admitted to the hospital 02/24/2016 for induction of labor.  Indication for induction: Postdates, Favorable cervix at term and oligo, AMA. Patient had an uncomplicated labor course as follows: Membrane Rupture Time/Date: 11:27 PM ,02/24/2016   Intrapartum Procedures: Episiotomy: None [1]                                         Lacerations:  None [1]  Patient had delivery of a Viable infant.  Information for the patient's newborn:  [name], Girl [name] [[id number]][[id number]]  Delivery Method: VBAC, Spontaneous (Filed from Delivery Summary)    02/25/2016  Details of delivery can be found in separate delivery note.  Patient had a routine postpartum course. Patient is discharged home 02/26/16.   Physical Exam:  Vitals:   02/25/16 1520 02/25/16 1620 02/25/16 2030 02/26/16 0632  BP: (!) 101/55 (!) 102/54 (!) 100/56 (!) 98/55  Pulse: 72 84 72 61  Resp: 20 18 16 18   Temp: 98.1 F (36.7 C) 98.2 F (36.8 C) 97.8 F (36.6 C) 97.8 F (36.6 C)  TempSrc:      SpO2:      Weight:      Height:       General: alert, cooperative and no distress Lochia: appropriate Uterine Fundus: firm Incision: N/A DVT Evaluation: No evidence of DVT seen on physical exam. Negative Homan's sign. No cords or calf tenderness. No significant calf/ankle edema.  Labs: Lab Results  Component Value Date  WBC 13.5 (H) 02/26/2016   HGB 9.6 (L) 02/26/2016   HCT 28.9 (L) 02/26/2016   MCV 84.8 02/26/2016   PLT 164 02/26/2016   CMP Latest Ref Rng & Units 09/19/2010  Glucose 70 - 99 mg/dL 161(W)  BUN 6 - 23 mg/dL 6  Creatinine 0.4 - 1.2 mg/dL 9.60  Sodium 454 - 098 mEq/L 138  Potassium 3.5 - 5.1 mEq/L 3.7  Chloride 96 - 112 mEq/L 107  CO2 19 - 32 mEq/L 23  Calcium 8.4 - 10.5 mg/dL 9.1  Total Protein 6.0 - 8.3 g/dL 6.6  Total Bilirubin 0.3 - 1.2 mg/dL 0.5  Alkaline Phos 39 - 117 U/L 35(L)  AST 0 - 37 U/L 15  ALT 0 - 35 U/L 13   ABO, Rh: B+ Antibody: Neg Rubella: Immune RPR: NR HBsAg: Neg HIV: Neg GBS: Positive Normal glucola testing Hgb 13 at NOB, 11.9 at 28 weeks Pap WNL 2015  Discharge instruction: per After Visit Summary and "Baby and Me Booklet".  After Visit Meds:    Medication List    TAKE these medications   MAGNESIUM PO Take 1 tablet by mouth at bedtime.   NATURE-THROID PO Take 1 tablet by mouth daily.   prenatal multivitamin Tabs tablet Take 1 tablet by mouth daily.     Ferrous Sulfate  Diet: General  Activity: Advance as tolerated. Pelvic rest for 6 weeks.   Outpatient follow up: 6 weeks  Postpartum  contraception: None  02/26/2016 [name], CNM

## 2016-02-26 NOTE — Anesthesia Postprocedure Evaluation (Signed)
Anesthesia Post Note  Patient: [name]  Procedure(s) Performed: * No procedures listed *  Patient location during evaluation: Mother Baby Anesthesia Type: Epidural Level of consciousness: awake and alert Pain management: pain level controlled Vital Signs Assessment: post-procedure vital signs reviewed and stable Respiratory status: spontaneous breathing, nonlabored ventilation and respiratory function stable Cardiovascular status: stable Postop Assessment: no headache, no backache, epidural receding and patient able to bend at knees Anesthetic complications: no     Last Vitals:  Vitals:   02/25/16 2030 02/26/16 0632  BP: (!) 100/56 (!) 98/55  Pulse: 72 61  Resp: 16 18  Temp: 36.6 C 36.6 C    Last Pain:  Vitals:   02/26/16 0631  TempSrc:   PainSc: 0-No pain   Pain Goal:                 [name],[name]

## 2017-06-05 DIAGNOSIS — F4320 Adjustment disorder, unspecified: Secondary | ICD-10-CM | POA: Diagnosis not present

## 2017-07-09 DIAGNOSIS — F4320 Adjustment disorder, unspecified: Secondary | ICD-10-CM | POA: Diagnosis not present

## 2017-08-15 DIAGNOSIS — D2261 Melanocytic nevi of right upper limb, including shoulder: Secondary | ICD-10-CM | POA: Diagnosis not present

## 2017-08-15 DIAGNOSIS — D225 Melanocytic nevi of trunk: Secondary | ICD-10-CM | POA: Diagnosis not present

## 2017-08-15 DIAGNOSIS — L821 Other seborrheic keratosis: Secondary | ICD-10-CM | POA: Diagnosis not present

## 2017-08-15 DIAGNOSIS — L814 Other melanin hyperpigmentation: Secondary | ICD-10-CM | POA: Diagnosis not present

## 2017-09-05 DIAGNOSIS — F4320 Adjustment disorder, unspecified: Secondary | ICD-10-CM | POA: Diagnosis not present

## 2017-09-11 DIAGNOSIS — F4320 Adjustment disorder, unspecified: Secondary | ICD-10-CM | POA: Diagnosis not present

## 2017-09-17 DIAGNOSIS — N941 Unspecified dyspareunia: Secondary | ICD-10-CM | POA: Diagnosis not present

## 2017-09-17 DIAGNOSIS — Z124 Encounter for screening for malignant neoplasm of cervix: Secondary | ICD-10-CM | POA: Diagnosis not present

## 2017-09-17 DIAGNOSIS — Z6824 Body mass index (BMI) 24.0-24.9, adult: Secondary | ICD-10-CM | POA: Diagnosis not present

## 2017-09-17 DIAGNOSIS — Z01419 Encounter for gynecological examination (general) (routine) without abnormal findings: Secondary | ICD-10-CM | POA: Diagnosis not present

## 2017-09-30 DIAGNOSIS — F4320 Adjustment disorder, unspecified: Secondary | ICD-10-CM | POA: Diagnosis not present

## 2017-10-18 DIAGNOSIS — F4320 Adjustment disorder, unspecified: Secondary | ICD-10-CM | POA: Diagnosis not present

## 2017-10-21 DIAGNOSIS — R05 Cough: Secondary | ICD-10-CM | POA: Diagnosis not present

## 2017-10-21 DIAGNOSIS — J0100 Acute maxillary sinusitis, unspecified: Secondary | ICD-10-CM | POA: Diagnosis not present

## 2017-11-01 DIAGNOSIS — F4320 Adjustment disorder, unspecified: Secondary | ICD-10-CM | POA: Diagnosis not present

## 2017-11-11 DIAGNOSIS — F4320 Adjustment disorder, unspecified: Secondary | ICD-10-CM | POA: Diagnosis not present

## 2017-11-27 DIAGNOSIS — F4320 Adjustment disorder, unspecified: Secondary | ICD-10-CM | POA: Diagnosis not present

## 2017-12-09 DIAGNOSIS — F4320 Adjustment disorder, unspecified: Secondary | ICD-10-CM | POA: Diagnosis not present

## 2017-12-31 DIAGNOSIS — M6281 Muscle weakness (generalized): Secondary | ICD-10-CM | POA: Diagnosis not present

## 2017-12-31 DIAGNOSIS — R102 Pelvic and perineal pain: Secondary | ICD-10-CM | POA: Diagnosis not present

## 2017-12-31 DIAGNOSIS — N3946 Mixed incontinence: Secondary | ICD-10-CM | POA: Diagnosis not present

## 2017-12-31 DIAGNOSIS — M62838 Other muscle spasm: Secondary | ICD-10-CM | POA: Diagnosis not present

## 2018-01-05 DIAGNOSIS — J208 Acute bronchitis due to other specified organisms: Secondary | ICD-10-CM | POA: Diagnosis not present

## 2018-01-09 DIAGNOSIS — M6281 Muscle weakness (generalized): Secondary | ICD-10-CM | POA: Diagnosis not present

## 2018-01-09 DIAGNOSIS — N3946 Mixed incontinence: Secondary | ICD-10-CM | POA: Diagnosis not present

## 2018-01-09 DIAGNOSIS — M62838 Other muscle spasm: Secondary | ICD-10-CM | POA: Diagnosis not present

## 2018-01-09 DIAGNOSIS — R102 Pelvic and perineal pain: Secondary | ICD-10-CM | POA: Diagnosis not present

## 2018-01-15 DIAGNOSIS — J0100 Acute maxillary sinusitis, unspecified: Secondary | ICD-10-CM | POA: Diagnosis not present

## 2018-01-20 DIAGNOSIS — N3946 Mixed incontinence: Secondary | ICD-10-CM | POA: Diagnosis not present

## 2018-01-20 DIAGNOSIS — R102 Pelvic and perineal pain: Secondary | ICD-10-CM | POA: Diagnosis not present

## 2018-01-20 DIAGNOSIS — M62838 Other muscle spasm: Secondary | ICD-10-CM | POA: Diagnosis not present

## 2018-01-20 DIAGNOSIS — M6281 Muscle weakness (generalized): Secondary | ICD-10-CM | POA: Diagnosis not present

## 2018-01-30 DIAGNOSIS — R102 Pelvic and perineal pain: Secondary | ICD-10-CM | POA: Diagnosis not present

## 2018-01-30 DIAGNOSIS — M6281 Muscle weakness (generalized): Secondary | ICD-10-CM | POA: Diagnosis not present

## 2018-01-30 DIAGNOSIS — N3946 Mixed incontinence: Secondary | ICD-10-CM | POA: Diagnosis not present

## 2018-01-30 DIAGNOSIS — M62838 Other muscle spasm: Secondary | ICD-10-CM | POA: Diagnosis not present

## 2018-02-11 DIAGNOSIS — N3946 Mixed incontinence: Secondary | ICD-10-CM | POA: Diagnosis not present

## 2018-02-11 DIAGNOSIS — M6281 Muscle weakness (generalized): Secondary | ICD-10-CM | POA: Diagnosis not present

## 2018-02-11 DIAGNOSIS — R102 Pelvic and perineal pain: Secondary | ICD-10-CM | POA: Diagnosis not present

## 2018-02-11 DIAGNOSIS — M62838 Other muscle spasm: Secondary | ICD-10-CM | POA: Diagnosis not present

## 2018-02-25 DIAGNOSIS — M6281 Muscle weakness (generalized): Secondary | ICD-10-CM | POA: Diagnosis not present

## 2018-02-25 DIAGNOSIS — N3946 Mixed incontinence: Secondary | ICD-10-CM | POA: Diagnosis not present

## 2018-02-25 DIAGNOSIS — R102 Pelvic and perineal pain: Secondary | ICD-10-CM | POA: Diagnosis not present

## 2018-02-25 DIAGNOSIS — M62838 Other muscle spasm: Secondary | ICD-10-CM | POA: Diagnosis not present

## 2018-03-31 DIAGNOSIS — Z411 Encounter for cosmetic surgery: Secondary | ICD-10-CM | POA: Diagnosis not present

## 2018-03-31 DIAGNOSIS — L700 Acne vulgaris: Secondary | ICD-10-CM | POA: Diagnosis not present

## 2018-05-06 DIAGNOSIS — F4320 Adjustment disorder, unspecified: Secondary | ICD-10-CM | POA: Diagnosis not present

## 2018-05-13 DIAGNOSIS — F4320 Adjustment disorder, unspecified: Secondary | ICD-10-CM | POA: Diagnosis not present

## 2018-05-19 DIAGNOSIS — F4320 Adjustment disorder, unspecified: Secondary | ICD-10-CM | POA: Diagnosis not present

## 2018-05-22 ENCOUNTER — Encounter: Payer: Self-pay | Admitting: Pulmonary Disease

## 2018-05-22 ENCOUNTER — Ambulatory Visit: Payer: BLUE CROSS/BLUE SHIELD | Admitting: Pulmonary Disease

## 2018-05-22 VITALS — BP 108/68 | HR 64 | Ht 70.0 in | Wt 175.2 lb

## 2018-05-22 DIAGNOSIS — R053 Chronic cough: Secondary | ICD-10-CM

## 2018-05-22 DIAGNOSIS — R05 Cough: Secondary | ICD-10-CM | POA: Diagnosis not present

## 2018-05-22 MED ORDER — ALBUTEROL SULFATE HFA 108 (90 BASE) MCG/ACT IN AERS: 2.0000 | INHALATION_SPRAY | Freq: Four times a day (QID) | RESPIRATORY_TRACT | 2 refills | Status: AC | PRN

## 2018-05-22 NOTE — Progress Notes (Signed)
Synopsis: Referred in 04/2018 for chronic bronchitis  Subjective:   PATIENT ID: [name] GENDER: female DOB: 1981/02/27, MRN: [id number]   HPI  Chief Complaint  Patient presents with  . consult    had bronchitis about 2 months ago.  left with a hacky non-prod cough and feels she can't take deep relieving breath.     [name] is a 37 year old female with chronic bronchitis who presents as new consultation for evaluation of persistent cough and chest tightness.  On chart review, PCP/Urgent Care Visit  (reviewed and summarized):  Urgent Care Visit 01/05/18  Seen for acute bronchitis after having cough x 3 weeks. Treated with prednisone and cough syrup.  Urgent Care Visit 10/21/17 Sinusitis. Treated with augmentin, bromfed and cough syrup  She reports hx of frequent episodes of bronchitis in childhood.  As an adult, she has at least bronchitis at least at least once a year that require an urgent care visit.  Since June after her last episode of bronchitis, she has had a lingering dry cough. She reports breathing is normal unless she is taking a deep breath. She reports a sensation of chest tightness. Associated with occasional wheeze. Her chest tightness seems to occur with allergies, musty rooms and scented candles. 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 Her symptoms improve when she avoids these triggers. She has anxiety which can worsens it.  Symptoms occur during both the day and night. Symptoms seem to start around May and last until late fall. Intermittently uses Nasonex with some allergy relief.  Symptoms do not limit her activity.  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 Denies hx of Stevens clot.  Denies fevers, chills, chest pain, shortness of breath, hemoptysis.  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 Account - previously in building concerning for  Asbestosis.   I have personally reviewed patient's past medical/family/social history, allergies, current medications.  Past Medical History:  Diagnosis Date  . Abnormal Pap smear   . Deviated septum   . Medical history non-contributory      Family History  Problem Relation Age of Onset  . Hypothyroidism Mother   . Hypertension Maternal Grandmother   . Diabetes Maternal Grandmother   . Asthma Paternal Grandmother   . Deep vein thrombosis Paternal Grandmother      Social History   Socioeconomic History  . Marital status: Married    Spouse name: [name]  . Number of children: 2  . Years of education: 38  . Highest education level: Not on file  Occupational History  . Occupation: Secretary/administrator: [employer]  Social Needs  . Financial resource strain: Not on file  . Food insecurity:    Worry: Not on file    Inability: Not on file  . Transportation needs:    Medical: Not on file    Non-medical: Not on file  Tobacco Use  . Smoking status: Never Smoker  . Smokeless tobacco: Never Used  Substance and Sexual Activity  . Alcohol use: No  . Drug use: No  . Sexual activity: Yes  Lifestyle  . Physical activity:    Days per week: Not on file    Minutes per session: Not on file  . Stress: Not on file  Relationships  . Social connections:    Talks on phone: Not on file    Gets together: Not on file    Attends religious  service: Not on file    Active member of club or organization: Not on file    Attends meetings of clubs or organizations: Not on file    Relationship status: Not on file  . Intimate partner violence:    Fear of current or ex partner: Not on file    Emotionally abused: Not on file    Physically abused: Not on file    Forced sexual activity: Not on file  Other Topics Concern  . Not on file  Social History Narrative  . Not on file     No Known Allergies   Outpatient Medications Prior to Visit  Medication Sig Dispense Refill  . MAGNESIUM PO  Take 1 tablet by mouth at bedtime.    . Prenatal Vit-Fe Fumarate-FA (PRENATAL MULTIVITAMIN) TABS Take 1 tablet by mouth daily.    . Thyroid (NATURE-THROID PO) Take 1 tablet by mouth daily.     No facility-administered medications prior to visit.     Review of Systems  Constitutional: Negative for chills, diaphoresis, fever, malaise/fatigue and weight loss.  HENT: Positive for congestion. Negative for sore throat.   Eyes: Negative for blurred vision and double vision.  Respiratory: Positive for cough and wheezing. Negative for hemoptysis, sputum production and shortness of breath.   Cardiovascular: Negative for chest pain, orthopnea, leg swelling and PND.  Gastrointestinal: Negative for abdominal pain, heartburn and nausea.  Musculoskeletal: Negative for myalgias.  Skin: Negative for rash.  Neurological: Negative for dizziness, weakness and headaches.  Endo/Heme/Allergies: Positive for environmental allergies.  Psychiatric/Behavioral: Negative for depression. The patient is nervous/anxious.   All other systems reviewed and are negative.     Objective:  Physical Exam  Constitutional: She is oriented to person, place, and time. She appears well-developed and well-nourished. No distress.  HENT:  Head: Normocephalic and atraumatic.  Nose: Nose normal.  Mouth/Throat: No oropharyngeal exudate.  Eyes: Pupils are equal, round, and reactive to light. Conjunctivae and EOM are normal. No scleral icterus.  Neck: Normal range of motion. Neck supple. No JVD present. No tracheal deviation present.  Cardiovascular: Normal rate, regular rhythm, normal heart sounds and intact distal pulses. Exam reveals no gallop and no friction rub.  No murmur heard. Pulmonary/Chest: Effort normal and breath sounds normal. No stridor. No respiratory distress. She has no wheezes. She has no rales.  Abdominal: Soft. Bowel sounds are normal. She exhibits no distension. There is no tenderness.  Musculoskeletal: Normal  range of motion. She exhibits no edema or deformity.  Lymphadenopathy:    She has no cervical adenopathy.  Neurological: She is alert and oriented to person, place, and time. No cranial nerve deficit or sensory deficit.  Skin: Skin is warm and dry. No rash noted. She is not diaphoretic. No erythema. No pallor.  Psychiatric: She has a normal mood and affect. Her behavior is normal. Judgment and thought content normal.  Vitals reviewed.    Vitals:   05/22/18 1120  BP: 108/68  Pulse: 64  SpO2: 98%  Weight: 175 lb 3.2 oz (79.5 kg)  Height: 5\' 10"  (1.778 m)   CBC    Component Value Date/Time   WBC 13.5 (H) 02/26/2016 0508   RBC 3.41 (L) 02/26/2016 0508   HGB 9.6 (L) 02/26/2016 0508   HCT 28.9 (L) 02/26/2016 0508   PLT 164 02/26/2016 0508   MCV 84.8 02/26/2016 0508   MCH 28.2 02/26/2016 0508   MCHC 33.2 02/26/2016 0508   RDW 13.4 02/26/2016 0508   LYMPHSABS 2.8 07/30/2012  1143   MONOABS 0.7 07/30/2012 1143   EOSABS 0.1 07/30/2012 1143   BASOSABS 0.0 07/30/2012 1143   Chest imaging: None on file  PFT: None on file  I have personally reviewed the above labs, images and tests noted above.    Assessment & Plan:   Chronic cough - Plan: Pulmonary function test, CANCELED: Pulmonary function test  Discussion: Chronic Cough We discussed multiple etiologies of chronic cough including obstructive airway disease asthma, upper airway cough syndrome and reflux.  Patient reported concern for Stevens clot due to family history however her presentation is not consistent (duration, no personal prior history of clots, normal vitals).  Her pretest probability for PE is low with score of 0.  Will evaluate symptoms with coronary function tests.  If tests return normal and conservative management is failed, will consider CT imaging.  --We will order pulmonary function breathing tests including Bronchodilator Testing and Methacholine Challenge to evaluate for asthma --Albuterol inhaler has been  prescribed for shortness of breath, chest tightness or wheezing. Please take albuterol every 4-6 hours as need for symptoms --Continue to take your nasal spray as directed daily for allergy symptoms --If testing is negative, we will consider obtaining chest imaging for further evaluation of your cough --For allergy management, continue nasal spray and take over-the-counter second-generation antihistamines as directed.  Follow-up in 2 months   Thank you for choosing [facility] for your health needs!   Auden Wettstein Mechele Collin, MD [facility] 05/23/2018 11:58 AM  Personal pager: [phone] If unanswered, please page CCM On-call: #[phone]    Current Outpatient Medications:  .  albuterol (PROVENTIL HFA;VENTOLIN HFA) 108 (90 Base) MCG/ACT inhaler, Inhale 2 puffs into the lungs every 6 (six) hours as needed for wheezing or shortness of breath., Disp: 1 Inhaler, Rfl: 2

## 2018-05-22 NOTE — Patient Instructions (Signed)
Chronic Cough Chest Tightness  --We will order pulmonary function breathing tests including Bronchodilator Testing and Methacholine Challenge to evaluate for asthma --Albuterol inhaler has been prescribed for shortness of breath, chest tightness or wheezing. Please take albuterol every 4-6 hours as need for symptoms --Continue to take your nasal spray as directed daily for allergy symptoms --If testing is negative, we will consider obtaining chest imaging for further evaluation of your cough  Follow-up in 2 months

## 2018-05-23 ENCOUNTER — Encounter: Payer: Self-pay | Admitting: Pulmonary Disease

## 2018-05-23 DIAGNOSIS — F4320 Adjustment disorder, unspecified: Secondary | ICD-10-CM | POA: Diagnosis not present

## 2018-05-28 ENCOUNTER — Ambulatory Visit (INDEPENDENT_AMBULATORY_CARE_PROVIDER_SITE_OTHER): Payer: BLUE CROSS/BLUE SHIELD | Admitting: Pulmonary Disease

## 2018-05-28 DIAGNOSIS — R05 Cough: Secondary | ICD-10-CM | POA: Diagnosis not present

## 2018-05-28 DIAGNOSIS — R053 Chronic cough: Secondary | ICD-10-CM

## 2018-05-28 LAB — PULMONARY FUNCTION TEST
DL/VA % pred: 86 %
DL/VA: 4.69 ml/min/mmHg/L
DLCO unc % pred: 93 %
DLCO unc: 29.56 ml/min/mmHg
FEF 25-75 Post: 4.7 L/s
FEF 25-75 Pre: 3.83 L/s
FEF2575-%Change-Post: 22 %
FEF2575-%Pred-Post: 131 %
FEF2575-%Pred-Pre: 107 %
FEV1-%Change-Post: 7 %
FEV1-%Pred-Post: 124 %
FEV1-%Pred-Pre: 115 %
FEV1-Post: 4.51 L
FEV1-Pre: 4.19 L
FEV1FVC-%Change-Post: 5 %
FEV1FVC-%Pred-Pre: 94 %
FEV6-%Change-Post: 2 %
FEV6-%Pred-Post: 125 %
FEV6-%Pred-Pre: 122 %
FEV6-Post: 5.48 L
FEV6-Pre: 5.34 L
FEV6FVC-%Pred-Post: 101 %
FEV6FVC-%Pred-Pre: 101 %
FVC-%Change-Post: 2 %
FVC-%Pred-Post: 123 %
FVC-%Pred-Pre: 120 %
FVC-Post: 5.48 L
FVC-Pre: 5.34 L
Post FEV1/FVC ratio: 82 %
Post FEV6/FVC ratio: 100 %
Pre FEV1/FVC ratio: 78 %
Pre FEV6/FVC Ratio: 100 %
RV % pred: 120 %
RV: 2.17 L
TLC % pred: 120 %
TLC: 7.1 L

## 2018-05-28 NOTE — Progress Notes (Signed)
PFT done today. 

## 2018-06-03 DIAGNOSIS — F4320 Adjustment disorder, unspecified: Secondary | ICD-10-CM | POA: Diagnosis not present

## 2018-06-12 DIAGNOSIS — F4320 Adjustment disorder, unspecified: Secondary | ICD-10-CM | POA: Diagnosis not present

## 2018-06-30 DIAGNOSIS — F4320 Adjustment disorder, unspecified: Secondary | ICD-10-CM | POA: Diagnosis not present

## 2018-07-07 DIAGNOSIS — F4320 Adjustment disorder, unspecified: Secondary | ICD-10-CM | POA: Diagnosis not present

## 2018-07-25 DIAGNOSIS — F4320 Adjustment disorder, unspecified: Secondary | ICD-10-CM | POA: Diagnosis not present

## 2018-08-01 ENCOUNTER — Telehealth: Payer: Self-pay | Admitting: Pulmonary Disease

## 2018-08-01 NOTE — Telephone Encounter (Signed)
Please contact patient regarding PFT results:  Mild obstruction on breathing test seen which warrants treatment.   COPD --START Spiriva Respimat 2.5 mcg 2 inhalations daily --SCHEDULE patient for follow-up with me in 1-2 months

## 2018-08-04 ENCOUNTER — Telehealth: Payer: Self-pay | Admitting: Pulmonary Disease

## 2018-08-04 ENCOUNTER — Other Ambulatory Visit: Payer: Self-pay

## 2018-08-04 MED ORDER — TIOTROPIUM BROMIDE MONOHYDRATE 2.5 MCG/ACT IN AERS: 2.0000 | INHALATION_SPRAY | Freq: Every day | RESPIRATORY_TRACT | 3 refills | Status: DC

## 2018-08-04 NOTE — Telephone Encounter (Signed)
Notified patient of PFT results as noted by Dr. Everardo All:  Mild obstruction on breathing test seen which warrants treatment.   COPD --START Spiriva Respimat 2.5 mcg 2 inhalations daily --SCHEDULE patient for follow-up with me in 1-2 months  Spiriva sent to [facility] per pt request and appt made for 09/18/18 at 930 am. Pt to keep appointment and call if anything further needed.

## 2018-08-07 ENCOUNTER — Ambulatory Visit: Payer: BLUE CROSS/BLUE SHIELD | Admitting: Pulmonary Disease

## 2018-08-07 ENCOUNTER — Telehealth: Payer: Self-pay

## 2018-08-07 NOTE — Telephone Encounter (Signed)
OK to order Incruse in place of Spiriva. Please notify patient.

## 2018-08-07 NOTE — Telephone Encounter (Signed)
Called patient, unable to reach and unable to leave voicemail as mailbox is full. Will try back.

## 2018-08-07 NOTE — Telephone Encounter (Signed)
Medication name and strength: Spiriva Respimat 2.5MCG/ACT Provider: Dr. Everardo All Pharmacy: [facility] pharmacy Patient insurance ID: [id number] Phone: [phone] Fax: [phone]  Was the PA started on CMM?  yes If yes, please enter the Key: [id number] Timeframe for approval/denial: 24-72 hours depending on patients insurance.    Also stated on CMM. Patient has not been diagnosed with Asthma in chart, and there was no documentation on patients inspiratory flow. The patients insurance stated that the covered alternative would be Incruse Ellipta 62.5MCG.  Patient needs to try and fail the incruse before they will cover the Spiriva. Will still await PA response just in case it is covered. Will also route this over to Dr. Everardo All and Angelique Blonder as an Lorain Childes.

## 2018-08-08 NOTE — Telephone Encounter (Signed)
I called pt but there was no answer and no VM to leave message. Will try again.  

## 2018-08-11 MED ORDER — UMECLIDINIUM BROMIDE 62.5 MCG/INH IN AEPB: 1.0000 | INHALATION_SPRAY | Freq: Every day | RESPIRATORY_TRACT | 3 refills | Status: DC

## 2018-08-11 NOTE — Telephone Encounter (Signed)
Called and spoke with patient, made her aware of message below. Patient verbalized understanding and gave me the ok to send in the prescription of Incruse. Prescription sent in. Nothing further needed.

## 2018-08-15 DIAGNOSIS — F4320 Adjustment disorder, unspecified: Secondary | ICD-10-CM | POA: Diagnosis not present

## 2018-08-22 DIAGNOSIS — F4320 Adjustment disorder, unspecified: Secondary | ICD-10-CM | POA: Diagnosis not present

## 2018-09-05 DIAGNOSIS — F4320 Adjustment disorder, unspecified: Secondary | ICD-10-CM | POA: Diagnosis not present

## 2018-09-11 NOTE — Telephone Encounter (Signed)
Spiriva was denied 

## 2018-09-18 ENCOUNTER — Ambulatory Visit: Payer: BLUE CROSS/BLUE SHIELD | Admitting: Pulmonary Disease

## 2018-09-18 DIAGNOSIS — Z124 Encounter for screening for malignant neoplasm of cervix: Secondary | ICD-10-CM | POA: Diagnosis not present

## 2018-09-18 DIAGNOSIS — R87610 Atypical squamous cells of undetermined significance on cytologic smear of cervix (ASC-US): Secondary | ICD-10-CM | POA: Diagnosis not present

## 2018-09-18 DIAGNOSIS — Z6824 Body mass index (BMI) 24.0-24.9, adult: Secondary | ICD-10-CM | POA: Diagnosis not present

## 2018-09-18 DIAGNOSIS — Z01419 Encounter for gynecological examination (general) (routine) without abnormal findings: Secondary | ICD-10-CM | POA: Diagnosis not present

## 2018-09-18 DIAGNOSIS — F419 Anxiety disorder, unspecified: Secondary | ICD-10-CM | POA: Diagnosis not present

## 2018-09-19 DIAGNOSIS — F4320 Adjustment disorder, unspecified: Secondary | ICD-10-CM | POA: Diagnosis not present

## 2018-09-29 ENCOUNTER — Other Ambulatory Visit: Payer: Self-pay | Admitting: Obstetrics and Gynecology

## 2018-09-29 DIAGNOSIS — R87610 Atypical squamous cells of undetermined significance on cytologic smear of cervix (ASC-US): Secondary | ICD-10-CM | POA: Diagnosis not present

## 2018-09-29 DIAGNOSIS — B977 Papillomavirus as the cause of diseases classified elsewhere: Secondary | ICD-10-CM | POA: Diagnosis not present

## 2018-10-02 DIAGNOSIS — E559 Vitamin D deficiency, unspecified: Secondary | ICD-10-CM | POA: Diagnosis not present

## 2018-10-02 DIAGNOSIS — E039 Hypothyroidism, unspecified: Secondary | ICD-10-CM | POA: Diagnosis not present

## 2018-10-02 DIAGNOSIS — F419 Anxiety disorder, unspecified: Secondary | ICD-10-CM | POA: Diagnosis not present

## 2018-10-02 DIAGNOSIS — R5381 Other malaise: Secondary | ICD-10-CM | POA: Diagnosis not present

## 2018-10-02 DIAGNOSIS — R5383 Other fatigue: Secondary | ICD-10-CM | POA: Diagnosis not present

## 2018-10-02 DIAGNOSIS — N920 Excessive and frequent menstruation with regular cycle: Secondary | ICD-10-CM | POA: Diagnosis not present

## 2018-10-03 DIAGNOSIS — F4320 Adjustment disorder, unspecified: Secondary | ICD-10-CM | POA: Diagnosis not present

## 2018-10-09 DIAGNOSIS — R5383 Other fatigue: Secondary | ICD-10-CM | POA: Diagnosis not present

## 2018-10-09 DIAGNOSIS — N920 Excessive and frequent menstruation with regular cycle: Secondary | ICD-10-CM | POA: Diagnosis not present

## 2018-10-09 DIAGNOSIS — F419 Anxiety disorder, unspecified: Secondary | ICD-10-CM | POA: Diagnosis not present

## 2018-10-09 DIAGNOSIS — R5381 Other malaise: Secondary | ICD-10-CM | POA: Diagnosis not present

## 2018-10-15 DIAGNOSIS — M9901 Segmental and somatic dysfunction of cervical region: Secondary | ICD-10-CM | POA: Diagnosis not present

## 2018-10-15 DIAGNOSIS — M6240 Contracture of muscle, unspecified site: Secondary | ICD-10-CM | POA: Diagnosis not present

## 2018-10-15 DIAGNOSIS — M9902 Segmental and somatic dysfunction of thoracic region: Secondary | ICD-10-CM | POA: Diagnosis not present

## 2018-10-20 DIAGNOSIS — R5381 Other malaise: Secondary | ICD-10-CM | POA: Diagnosis not present

## 2018-10-20 DIAGNOSIS — E559 Vitamin D deficiency, unspecified: Secondary | ICD-10-CM | POA: Diagnosis not present

## 2018-10-20 DIAGNOSIS — E7212 Methylenetetrahydrofolate reductase deficiency: Secondary | ICD-10-CM | POA: Diagnosis not present

## 2018-10-20 DIAGNOSIS — R5383 Other fatigue: Secondary | ICD-10-CM | POA: Diagnosis not present

## 2018-10-21 ENCOUNTER — Ambulatory Visit (INDEPENDENT_AMBULATORY_CARE_PROVIDER_SITE_OTHER): Payer: Self-pay | Admitting: Adult Health

## 2018-10-21 ENCOUNTER — Ambulatory Visit: Payer: BLUE CROSS/BLUE SHIELD | Admitting: Pulmonary Disease

## 2018-10-21 ENCOUNTER — Encounter: Payer: Self-pay | Admitting: Adult Health

## 2018-10-21 ENCOUNTER — Other Ambulatory Visit: Payer: Self-pay

## 2018-10-21 DIAGNOSIS — R053 Chronic cough: Secondary | ICD-10-CM

## 2018-10-21 DIAGNOSIS — J301 Allergic rhinitis due to pollen: Secondary | ICD-10-CM

## 2018-10-21 DIAGNOSIS — R05 Cough: Secondary | ICD-10-CM

## 2018-10-21 NOTE — Progress Notes (Signed)
Virtual Visit via Telephone Note  I connected with [name] on 10/21/18 at  3:00 PM EDT by telephone and verified that I am speaking with the correct person using two identifiers.   I discussed the limitations, risks, security and privacy concerns of performing an evaluation and management service by telephone and the availability of in person appointments. I also discussed with the patient that there may be a patient responsible charge related to this service. The patient expressed understanding and agreed to proceed.   History of Present Illness: 38 year old female never smoker seen for pulmonary consult May 22, 2018 for cough and recurrent episodes of bronchitis.  Complained of an recurrent cough several times a year treated as bronchitis.  Seems to be worse in the spring and fall.  She is a never smoker.  She is a stay-at-home mom.  She was set up for a pulmonary function test that showed normal lung function with an FEV1 at 124%, ratio 82, FVC 123%, no significant bronchodilator response.  Positive mid flow reversibility.  (Mid flows were normal).  DLCO 93%. Patient was recommended to start on Spiriva.  Patient says her insurance would not cover this.  She was called and Incruse however this was also held up unclear the exact reason why patient says that she would prefer not to take these inhalers.  She has barely had to use her albuterol and does not feel like that she needs them at this time.  She does have an intermittent cough sometimes has mucus sometimes is dry.  She does have postnasal drainage.  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  She has stopped using these products.  Does feel that she is seeing some benefit in her cough with this.  Also has given up gluten which is seems to be helping as well.  She denies any weight loss, hemoptysis, chest pain.  No significant  wheezing.  And no albuterol use.  Observations/Objective: PFTs 05/28/2018 FEV1 124%, ratio 82, FVC 123% no significant bronchodilator response, DLCO 93%. - Inspiratory flow vol loop has some variability   Assessment and Plan: Chronic cough suspect is related to allergic rhinitis with postnasal drainage.  She may also have a component of reactive airways.  PFTs show no evidence of restriction or obstruction. For now we will hold on Spiriva and/Incruse.  He may use her albuterol as needed control for triggers with Claritin and Flonase.  May use Delsym as needed for cough control.  May also use of honey for cough. Continue with dietary restrictions with gluten and dairy products. Chest x-ray on return.  We decided with the [place] pandemic we would hold off bringing her into the office for this chest x-ray she can have it done in 3 months. On return if not better, look to see if extrapulmonary etiology for cough .   Follow Up Instructions: Follow up with 3 months with Dr. Everardo All and as needed  Please contact office for sooner follow up if symptoms do not improve or worsen or seek emergency care      I discussed the assessment and treatment plan with the patient. The patient was provided an opportunity to ask questions and all were answered. The patient agreed with the plan and demonstrated an understanding of the instructions.   The patient was advised to call back or seek an in-person evaluation if  the symptoms worsen or if the condition fails to improve as anticipated.  I provided 23 minutes of non-face-to-face time during this encounter.   Rubye Oaks, NP

## 2018-10-21 NOTE — Patient Instructions (Addendum)
May remain off Spiriva and Incruse.  Continue dietary restrictions as discussed.  Claritin 10mg  daily for 2 weeks and As needed   Begin Flonase 2 puffs daily  Albuterol As needed  - this is your rescue inhaler .  Follow up with Dr. Everardo All in 3 months with chest xray and As needed   Please contact office for sooner follow up if symptoms do not improve or worsen or seek emergency care

## 2018-12-31 DIAGNOSIS — J45909 Unspecified asthma, uncomplicated: Secondary | ICD-10-CM | POA: Diagnosis not present

## 2019-04-20 DIAGNOSIS — Z0000 Encounter for general adult medical examination without abnormal findings: Secondary | ICD-10-CM | POA: Diagnosis not present

## 2019-04-20 DIAGNOSIS — Z7689 Persons encountering health services in other specified circumstances: Secondary | ICD-10-CM | POA: Diagnosis not present

## 2019-04-20 DIAGNOSIS — F900 Attention-deficit hyperactivity disorder, predominantly inattentive type: Secondary | ICD-10-CM | POA: Diagnosis not present

## 2019-04-20 DIAGNOSIS — E349 Endocrine disorder, unspecified: Secondary | ICD-10-CM | POA: Diagnosis not present

## 2019-04-21 DIAGNOSIS — Z0000 Encounter for general adult medical examination without abnormal findings: Secondary | ICD-10-CM | POA: Diagnosis not present

## 2019-04-21 DIAGNOSIS — E349 Endocrine disorder, unspecified: Secondary | ICD-10-CM | POA: Diagnosis not present

## 2019-04-23 DIAGNOSIS — Z03818 Encounter for observation for suspected exposure to other biological agents ruled out: Secondary | ICD-10-CM | POA: Diagnosis not present

## 2019-04-23 DIAGNOSIS — Z20828 Contact with and (suspected) exposure to other viral communicable diseases: Secondary | ICD-10-CM | POA: Diagnosis not present

## 2019-06-08 DIAGNOSIS — D509 Iron deficiency anemia, unspecified: Secondary | ICD-10-CM | POA: Diagnosis not present

## 2019-06-17 DIAGNOSIS — L2489 Irritant contact dermatitis due to other agents: Secondary | ICD-10-CM | POA: Diagnosis not present

## 2019-06-23 HISTORY — PX: AUGMENTATION MAMMAPLASTY: SUR837

## 2020-04-06 ENCOUNTER — Other Ambulatory Visit: Payer: Self-pay

## 2020-04-06 DIAGNOSIS — Z20822 Contact with and (suspected) exposure to covid-19: Secondary | ICD-10-CM

## 2020-04-07 LAB — NOVEL CORONAVIRUS, NAA: SARS-CoV-2, NAA: NOT DETECTED

## 2020-04-07 LAB — SARS-COV-2, NAA 2 DAY TAT

## 2020-04-18 ENCOUNTER — Other Ambulatory Visit: Payer: Managed Care, Other (non HMO)

## 2020-04-18 DIAGNOSIS — Z20822 Contact with and (suspected) exposure to covid-19: Secondary | ICD-10-CM

## 2020-04-20 LAB — NOVEL CORONAVIRUS, NAA: SARS-CoV-2, NAA: NOT DETECTED

## 2020-04-20 LAB — SARS-COV-2, NAA 2 DAY TAT

## 2020-04-25 ENCOUNTER — Other Ambulatory Visit: Payer: Self-pay | Admitting: Internal Medicine

## 2020-04-25 DIAGNOSIS — R202 Paresthesia of skin: Secondary | ICD-10-CM

## 2020-05-06 ENCOUNTER — Other Ambulatory Visit: Payer: Managed Care, Other (non HMO)

## 2020-09-20 HISTORY — PX: AUGMENTATION MAMMAPLASTY: SUR837

## 2021-05-26 ENCOUNTER — Other Ambulatory Visit: Payer: Self-pay

## 2022-08-28 ENCOUNTER — Other Ambulatory Visit: Payer: Self-pay | Admitting: Obstetrics and Gynecology

## 2022-08-28 DIAGNOSIS — N6320 Unspecified lump in the left breast, unspecified quadrant: Secondary | ICD-10-CM

## 2022-09-19 ENCOUNTER — Ambulatory Visit
Admission: RE | Admit: 2022-09-19 | Discharge: 2022-09-19 | Disposition: A | Discharge status: Home / Self Care | Payer: Managed Care, Other (non HMO) | Source: Ambulatory Visit | Attending: Obstetrics and Gynecology | Admitting: Obstetrics and Gynecology

## 2022-09-19 DIAGNOSIS — N6320 Unspecified lump in the left breast, unspecified quadrant: Secondary | ICD-10-CM

## 2023-08-09 ENCOUNTER — Other Ambulatory Visit: Payer: Self-pay | Admitting: Obstetrics and Gynecology

## 2023-08-09 DIAGNOSIS — Z1231 Encounter for screening mammogram for malignant neoplasm of breast: Secondary | ICD-10-CM

## 2023-09-24 ENCOUNTER — Ambulatory Visit
Admission: RE | Admit: 2023-09-24 | Discharge: 2023-09-24 | Disposition: A | Discharge status: Home / Self Care | Payer: Managed Care, Other (non HMO) | Source: Ambulatory Visit | Attending: Obstetrics and Gynecology | Admitting: Obstetrics and Gynecology

## 2023-09-24 DIAGNOSIS — Z1231 Encounter for screening mammogram for malignant neoplasm of breast: Secondary | ICD-10-CM

## 2024-08-07 ENCOUNTER — Other Ambulatory Visit: Payer: Self-pay | Admitting: Obstetrics and Gynecology

## 2024-08-07 ENCOUNTER — Other Ambulatory Visit

## 2024-08-07 DIAGNOSIS — N6310 Unspecified lump in the right breast, unspecified quadrant: Secondary | ICD-10-CM

## 2024-08-07 DIAGNOSIS — N6320 Unspecified lump in the left breast, unspecified quadrant: Secondary | ICD-10-CM

## 2024-08-26 ENCOUNTER — Other Ambulatory Visit

## 2024-08-26 ENCOUNTER — Encounter

## 2024-08-27 ENCOUNTER — Ambulatory Visit

## 2024-08-27 ENCOUNTER — Ambulatory Visit
Admission: RE | Admit: 2024-08-27 | Discharge: 2024-08-27 | Disposition: A | Source: Ambulatory Visit | Attending: Obstetrics and Gynecology | Admitting: Obstetrics and Gynecology

## 2024-08-27 DIAGNOSIS — N6320 Unspecified lump in the left breast, unspecified quadrant: Secondary | ICD-10-CM

## 2024-08-27 DIAGNOSIS — N6310 Unspecified lump in the right breast, unspecified quadrant: Secondary | ICD-10-CM
